# Patient Record
Sex: Male | Born: 1975
Health system: Southern US, Community
[De-identification: ages and names within clinical notes are randomized; demographics above are authoritative.]

## PROBLEM LIST (undated history)

## (undated) DIAGNOSIS — J449 Chronic obstructive pulmonary disease, unspecified: Secondary | ICD-10-CM

## (undated) DIAGNOSIS — G4730 Sleep apnea, unspecified: Secondary | ICD-10-CM

## (undated) DIAGNOSIS — S3992XA Unspecified injury of lower back, initial encounter: Secondary | ICD-10-CM

## (undated) DIAGNOSIS — R0600 Dyspnea, unspecified: Secondary | ICD-10-CM

## (undated) DIAGNOSIS — I10 Essential (primary) hypertension: Secondary | ICD-10-CM

## (undated) HISTORY — PX: CARPAL TUNNEL RELEASE: SHX101

---

## 2004-03-22 ENCOUNTER — Emergency Department: Payer: Self-pay | Admitting: Emergency Medicine

## 2004-11-28 ENCOUNTER — Emergency Department: Payer: Self-pay | Admitting: Internal Medicine

## 2005-01-09 ENCOUNTER — Emergency Department: Payer: Self-pay | Admitting: Emergency Medicine

## 2007-10-23 ENCOUNTER — Ambulatory Visit: Payer: Self-pay | Admitting: Internal Medicine

## 2008-01-14 ENCOUNTER — Emergency Department: Payer: Self-pay | Admitting: Emergency Medicine

## 2010-02-19 ENCOUNTER — Ambulatory Visit: Payer: Self-pay | Admitting: Family Medicine

## 2010-07-06 ENCOUNTER — Ambulatory Visit: Payer: Self-pay | Admitting: Internal Medicine

## 2010-08-06 ENCOUNTER — Ambulatory Visit: Payer: Self-pay | Admitting: Internal Medicine

## 2011-01-27 ENCOUNTER — Ambulatory Visit: Payer: Self-pay

## 2011-07-08 ENCOUNTER — Ambulatory Visit: Payer: Self-pay

## 2013-10-11 DIAGNOSIS — G8929 Other chronic pain: Secondary | ICD-10-CM | POA: Insufficient documentation

## 2013-10-11 DIAGNOSIS — M4306 Spondylolysis, lumbar region: Secondary | ICD-10-CM | POA: Insufficient documentation

## 2014-02-06 ENCOUNTER — Ambulatory Visit: Payer: Self-pay | Admitting: Neurology

## 2014-02-19 ENCOUNTER — Ambulatory Visit: Payer: Self-pay | Admitting: Neurology

## 2014-03-21 DIAGNOSIS — G4730 Sleep apnea, unspecified: Secondary | ICD-10-CM | POA: Insufficient documentation

## 2014-03-21 DIAGNOSIS — K219 Gastro-esophageal reflux disease without esophagitis: Secondary | ICD-10-CM | POA: Insufficient documentation

## 2014-03-21 DIAGNOSIS — E785 Hyperlipidemia, unspecified: Secondary | ICD-10-CM | POA: Insufficient documentation

## 2014-09-17 DIAGNOSIS — G5603 Carpal tunnel syndrome, bilateral upper limbs: Secondary | ICD-10-CM | POA: Insufficient documentation

## 2015-02-14 ENCOUNTER — Encounter: Payer: Self-pay | Admitting: Emergency Medicine

## 2015-02-14 ENCOUNTER — Ambulatory Visit
Admission: EM | Admit: 2015-02-14 | Discharge: 2015-02-14 | Disposition: A | Discharge status: Home / Self Care | Payer: 59 | Attending: Family Medicine | Admitting: Family Medicine

## 2015-02-14 DIAGNOSIS — L089 Local infection of the skin and subcutaneous tissue, unspecified: Secondary | ICD-10-CM

## 2015-02-14 MED ORDER — DOXYCYCLINE HYCLATE 100 MG PO CAPS: 100.0000 mg | ORAL_CAPSULE | Freq: Two times a day (BID) | ORAL | Status: DC

## 2015-02-14 NOTE — ED Provider Notes (Signed)
Patient presents today with symptoms of erythema and tenderness to the left lower leg. Patient states that he has had the symptoms for the last couple days. He believes that he got bit by a spider. Denies any fever or chills. Denies any myalgias or joint pain. No other similar rash on the body. He does admit to sticking a knife in the area to drain it yesterday. He admits that nothing seemed to drain from the area after doing this. Tetanus immunization up-to-date per patient.  ROS: Negative except mentioned above. Vitals as per Epic.  GENERAL: NAD RESP: CTA B CARD: RRR SKIN: quarter sized erythematous area to the left medial aspect of lower leg, slight raised and tender, no drainage or streaking from the site NEURO: CN II-XII grossly intact   A/P: R LE Skin Infection- Will treat with doxycycline for 10 days, keep area clean and dry, if any worsening symptoms patient is to seek medical attention as directed.   Jolene Provost, MD 02/14/15 1239

## 2015-02-14 NOTE — ED Notes (Signed)
Pt with a insect bite to left leg x 2 days

## 2015-04-30 DIAGNOSIS — M65311 Trigger thumb, right thumb: Secondary | ICD-10-CM | POA: Insufficient documentation

## 2015-07-11 DIAGNOSIS — E781 Pure hyperglyceridemia: Secondary | ICD-10-CM | POA: Insufficient documentation

## 2015-12-26 DIAGNOSIS — F5101 Primary insomnia: Secondary | ICD-10-CM | POA: Insufficient documentation

## 2016-01-23 ENCOUNTER — Emergency Department: Payer: BLUE CROSS/BLUE SHIELD

## 2016-01-23 ENCOUNTER — Encounter: Payer: Self-pay | Admitting: Emergency Medicine

## 2016-01-23 ENCOUNTER — Emergency Department
Admission: EM | Admit: 2016-01-23 | Discharge: 2016-01-23 | Disposition: A | Discharge status: Home / Self Care | Payer: BLUE CROSS/BLUE SHIELD | Attending: Emergency Medicine | Admitting: Emergency Medicine

## 2016-01-23 DIAGNOSIS — Z792 Long term (current) use of antibiotics: Secondary | ICD-10-CM | POA: Insufficient documentation

## 2016-01-23 DIAGNOSIS — Y9389 Activity, other specified: Secondary | ICD-10-CM | POA: Diagnosis not present

## 2016-01-23 DIAGNOSIS — F17210 Nicotine dependence, cigarettes, uncomplicated: Secondary | ICD-10-CM | POA: Diagnosis not present

## 2016-01-23 DIAGNOSIS — Y92410 Unspecified street and highway as the place of occurrence of the external cause: Secondary | ICD-10-CM | POA: Diagnosis not present

## 2016-01-23 DIAGNOSIS — I10 Essential (primary) hypertension: Secondary | ICD-10-CM | POA: Diagnosis not present

## 2016-01-23 DIAGNOSIS — Y999 Unspecified external cause status: Secondary | ICD-10-CM | POA: Insufficient documentation

## 2016-01-23 DIAGNOSIS — S40022A Contusion of left upper arm, initial encounter: Secondary | ICD-10-CM | POA: Diagnosis not present

## 2016-01-23 DIAGNOSIS — J449 Chronic obstructive pulmonary disease, unspecified: Secondary | ICD-10-CM | POA: Insufficient documentation

## 2016-01-23 DIAGNOSIS — S4992XA Unspecified injury of left shoulder and upper arm, initial encounter: Secondary | ICD-10-CM | POA: Diagnosis present

## 2016-01-23 HISTORY — DX: Chronic obstructive pulmonary disease, unspecified: J44.9

## 2016-01-23 HISTORY — DX: Essential (primary) hypertension: I10

## 2016-01-23 MED ORDER — HYDROCODONE-ACETAMINOPHEN 5-325 MG PO TABS: 1.0000 | ORAL_TABLET | ORAL | 0 refills | Status: DC | PRN
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
## 2016-01-23 NOTE — Discharge Instructions (Signed)
Ice and elevation. Norco for pain. Follow-up with your primary care doctor in [place] if any continued problems.
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
## 2016-09-01 ENCOUNTER — Encounter: Payer: Self-pay | Admitting: *Deleted

## 2016-09-01 ENCOUNTER — Ambulatory Visit
Admission: EM | Admit: 2016-09-01 | Discharge: 2016-09-01 | Disposition: A | Discharge status: Home / Self Care | Payer: Self-pay | Attending: Family Medicine | Admitting: Family Medicine

## 2016-09-01 ENCOUNTER — Ambulatory Visit (INDEPENDENT_AMBULATORY_CARE_PROVIDER_SITE_OTHER): Payer: Self-pay

## 2016-09-01 DIAGNOSIS — S52502A Unspecified fracture of the lower end of left radius, initial encounter for closed fracture: Secondary | ICD-10-CM

## 2016-09-01 DIAGNOSIS — S52515A Nondisplaced fracture of left radial styloid process, initial encounter for closed fracture: Secondary | ICD-10-CM

## 2016-09-01 HISTORY — DX: Unspecified injury of lower back, initial encounter: S39.92XA

## 2016-09-01 MED ORDER — HYDROCODONE-ACETAMINOPHEN 5-325 MG PO TABS: ORAL_TABLET | ORAL | 0 refills | Status: DC

## 2016-09-01 NOTE — Discharge Instructions (Signed)
Follow up with orthopedist within one week

## 2016-09-01 NOTE — ED Provider Notes (Signed)
[facility]    CSN: [id number] Arrival date & time: 09/01/16  1459     History   Chief Complaint Chief Complaint  Patient presents with  . Wrist Pain    HPI Richard Ferguson is a 41 y.o. male.   41 yo male presents with a c/o left wrist pain and swelling  after slipping and falling in his shop today. States he landed on his outstretched hand.    The history is provided by the patient.  Wrist Pain     Past Medical History:  Diagnosis Date  . Back injuries   . COPD (chronic obstructive pulmonary disease) (HCC)   . Hypertension     There are no active problems to display for this patient.   History reviewed. No pertinent surgical history.     Home Medications    Prior to Admission medications   Medication Sig Start Date End Date Taking? Authorizing Provider  cyclobenzaprine (FLEXERIL) 10 MG tablet Take 10 mg by mouth at bedtime.   Yes Historical Provider, MD  valsartan (DIOVAN) 320 MG tablet Take 320 mg by mouth daily.   Yes Historical Provider, MD  doxycycline (VIBRAMYCIN) 100 MG capsule Take 1 capsule (100 mg total) by mouth 2 (two) times daily. 02/14/15   Jolene Provost, MD  HYDROcodone-acetaminophen (NORCO/VICODIN) 5-325 MG tablet 1-2 tabs po bid prn 09/01/16   Payton Mccallum, MD    Family History History reviewed. No pertinent family history.  Social History Social History  Substance Use Topics  . Smoking status: Current Every Day Smoker    Packs/day: 2.00    Types: Cigarettes  . Smokeless tobacco: Never Used  . Alcohol use No     Allergies   Aleve [naproxen sodium]   Review of Systems Review of Systems   Physical Exam Triage Vital Signs ED Triage Vitals  Enc Vitals Group     BP 09/01/16 1511 (!) 149/110     Pulse Rate 09/01/16 1511 (!) 112     Resp 09/01/16 1511 18     Temp 09/01/16 1511 98.2 F (36.8 C)     Temp Source 09/01/16 1511 Oral     SpO2 09/01/16 1511 98 %     Weight 09/01/16 1512 295 lb (133.8 kg)     Height  09/01/16 1512  (1.803 m)     Head Circumference --      Peak Flow --      Pain Score 09/01/16 1512 6     Pain Loc --      Pain Edu? --      Excl. in GC? --    No data found.   Updated Vital Signs BP (!) 149/110 (BP Location: Right Arm)   Pulse (!) 112   Temp 98.2 F (36.8 C) (Oral)   Resp 18   Ht  (1.803 m)   Wt 295 lb (133.8 kg)   SpO2 98%   BMI 41.14 kg/m   Visual Acuity Right Eye Distance:   Left Eye Distance:   Bilateral Distance:    Right Eye Near:   Left Eye Near:    Bilateral Near:     Physical Exam  Constitutional: He appears well-developed and well-nourished. No distress.  Musculoskeletal:       Left wrist: He exhibits decreased range of motion, tenderness, bony tenderness and swelling. He exhibits no effusion, no crepitus, no deformity and no laceration.  Skin: He is not diaphoretic.  Nursing note and vitals reviewed.  UC Treatments / Results  Labs (all labs ordered are listed, but only abnormal results are displayed) Labs Reviewed - No data to display  EKG  EKG Interpretation None       Radiology Dg Wrist Complete Left  Result Date: 09/01/2016 CLINICAL DATA:  Fall on outstretched hands with wrist pain, initial encounter EXAM: LEFT WRIST - COMPLETE 3+ VIEW COMPARISON:  None. FINDINGS: Small avulsion fracture from the ulnar styloid is noted. On the lateral projection there is an undisplaced fracture along the posterior aspect of the radius without significant displacement. This is not well visualized on any of the other images. No other focal abnormality is seen. IMPRESSION: Distal radial and ulnar fractures as described. Electronically Signed   By: Alcide Clever M.D.   On: 09/01/2016 15:56    Procedures Procedures (including critical care time)  Medications Ordered in UC Medications - No data to display   Initial Impression / Assessment and Plan / UC Course  I have reviewed the triage vital signs and the nursing  notes.  Pertinent labs & imaging results that were available during my care of the patient were reviewed by me and considered in my medical decision making (see chart for details).       Final Clinical Impressions(s) / UC Diagnoses   Final diagnoses:  Closed traumatic nondisplaced fracture of distal end of left radius, initial encounter  Closed nondisplaced fracture of styloid process of left radius, initial encounter    New Prescriptions Discharge Medication List as of 09/01/2016  4:42 PM     1. x-ray results and diagnosis reviewed with patient 2. Wrist/forearm immobilized with sugar tong splint; patient given sling 3. rx as per orders above; reviewed possible side effects, interactions, risks and benefits; rx printed for vicodin 3. Recommend supportive treatment with elevation, otc analgesics prn 4. Recommend follow up with orthopedic surgeon this week 5. Follow-up here prn    Payton Mccallum, MD 09/01/16 1752

## 2016-09-01 NOTE — ED Triage Notes (Signed)
Patient slipped and fell on his left wrist today. Swelling is visible above the left posterior left hand.

## 2016-11-29 ENCOUNTER — Ambulatory Visit
Admission: EM | Admit: 2016-11-29 | Discharge: 2016-11-29 | Disposition: A | Discharge status: Home / Self Care | Payer: Self-pay | Attending: Family Medicine | Admitting: Family Medicine

## 2016-11-29 ENCOUNTER — Ambulatory Visit (INDEPENDENT_AMBULATORY_CARE_PROVIDER_SITE_OTHER): Payer: Self-pay

## 2016-11-29 DIAGNOSIS — J441 Chronic obstructive pulmonary disease with (acute) exacerbation: Secondary | ICD-10-CM

## 2016-11-29 DIAGNOSIS — R0602 Shortness of breath: Secondary | ICD-10-CM

## 2016-11-29 DIAGNOSIS — R059 Cough, unspecified: Secondary | ICD-10-CM

## 2016-11-29 DIAGNOSIS — R05 Cough: Secondary | ICD-10-CM

## 2016-11-29 DIAGNOSIS — J040 Acute laryngitis: Secondary | ICD-10-CM

## 2016-11-29 MED ORDER — DOXYCYCLINE HYCLATE 100 MG PO CAPS: 100.0000 mg | ORAL_CAPSULE | Freq: Two times a day (BID) | ORAL | 0 refills | Status: DC

## 2016-11-29 MED ORDER — PREDNISONE 10 MG PO TABS
ORAL_TABLET | ORAL | 0 refills | Status: DC
Start: 2016-11-29 — End: 2017-03-22

## 2016-11-29 MED ORDER — IPRATROPIUM-ALBUTEROL 0.5-2.5 (3) MG/3ML IN SOLN
3.0000 mL | Freq: Once | RESPIRATORY_TRACT | Status: AC
  Administered 2016-11-29: 3 mL via RESPIRATORY_TRACT

## 2016-11-29 MED ORDER — ALBUTEROL SULFATE HFA 108 (90 BASE) MCG/ACT IN AERS: 2.0000 | INHALATION_SPRAY | RESPIRATORY_TRACT | 0 refills | Status: DC | PRN

## 2016-11-29 NOTE — Discharge Instructions (Signed)
Take medication as prescribed. Rest. Drink plenty of fluids.   Follow up with your primary care physician this week as needed. Return to Urgent care or Emergency room for new or worsening concerns.

## 2016-11-29 NOTE — ED Provider Notes (Addendum)
[facility] ____________________________________________  Time seen: Approximately 11:59 AM  I have reviewed the triage vital signs and the nursing notes.   HISTORY  Chief Complaint Cough; Shortness of Breath; and Laryngitis  HPI Richard Ferguson is a 41 y.o. male presenting for evaluation 8 days runny nose, nasal congestion, cough and chest congestion. Reports his symptoms have gradually worsened. Patient reports he is now having chest tightness and shortness of breath. Reports wheezing with associated chest tightness and shortness of breath. Reports he does have a chronic history of COPD, chronic smoker with a history of chronic bronchitis. Patient states he has not had issues in the last few years and no longer has inhalers to use at home. States cough is primarily a dry hacking cough. Denies hemoptysis. Reports nasal congestion has overall improved. Reports some scratchy throat sensation, denies sore throat. Denies sinus pressure. Reports this does feel similar to previous COPD exacerbations. Reports no shortness of breath on exertion. Reports is continue to remain active, continuing to work and be outside. States over-the-counter Chloraseptic spray does help with his cough. Denies taking other over-the-counter medications. Denies other aggravating or alleviating factors.  Denies chest pain, chest pain with deep breath, abdominal pain, dysuria, extremity pain, extremity swelling or rash. Denies recent trip. Denies history of cancer. Denies previous PE or DVT. Denies recent sickness. Denies recent antibiotic use.   PCP: Maryjane HurterFeldpausch     Past Medical History:  Diagnosis Date  . Back injuries   . COPD (chronic obstructive pulmonary disease) (HCC)   . Hypertension     There are no active problems to display for this patient.   History reviewed. No pertinent surgical history.   No current facility-administered medications for this encounter.   Current Outpatient  Prescriptions:  .  cyclobenzaprine (FLEXERIL) 10 MG tablet, Take 10 mg by mouth at bedtime., Disp: , Rfl:  .  valsartan (DIOVAN) 320 MG tablet, Take 320 mg by mouth daily., Disp: , Rfl:   Allergies Aleve [naproxen sodium]  No family history on file.  Social History Social History  Substance Use Topics  . Smoking status: Current Every Day Smoker    Packs/day: 2.00    Types: Cigarettes  . Smokeless tobacco: Never Used  . Alcohol use No    Review of Systems Constitutional: No fever/chills ENT: No sore throat. Cardiovascular: Denies chest pain. Respiratory: Positive shortness of breath. Gastrointestinal: No abdominal pain. . Genitourinary: Negative for dysuria. Musculoskeletal: Chronic back pain, denies change.  Skin: Negative for rash.  ____________________________________________   PHYSICAL EXAM:  VITAL SIGNS: ED Triage Vitals  Enc Vitals Group     BP 11/29/16 1143 (!) 170/92     Pulse Rate 11/29/16 1143 97     Resp -- 18     Temp 11/29/16 1143 98.1 F (36.7 C)     Temp Source 11/29/16 1143 Oral     SpO2 11/29/16 1143 98 %     Weight 11/29/16 1146 291 lb (132 kg)     Height 11/29/16 1146 5\' 11"  (1.803 m)     Head Circumference --      Peak Flow --      Pain Score --      Pain Loc --      Pain Edu? --      Excl. in GC? --    Constitutional: Alert and oriented. Well appearing and in no acute distress. Eyes: Conjunctivae are normal.  Head: Atraumatic. No sinus tenderness to palpation. No swelling. No erythema.  Ears: no erythema, normal TMs bilaterally.   Nose:Mild nasal congestion.  Mouth/Throat: Mucous membranes are moist. No pharyngeal erythema. No tonsillar swelling or exudate.  Neck: No stridor.  No cervical spine tenderness to palpation. Hematological/Lymphatic/Immunilogical: No cervical lymphadenopathy. Cardiovascular: Normal rate, regular rhythm. Grossly normal heart sounds.  Good peripheral circulation. Respiratory: Normal respiratory effort.  No  retractions.  Mild inspiratory and expiratory wheezes throughout. No focal area of consolidation auscultated. Speaks in complete sentences.  Gastrointestinal: Soft and nontender. Normal Bowel sounds. No CVA tenderness. Musculoskeletal: Ambulatory with steady gait. No cervical, thoracic or lumbar tenderness to palpation. No lower extremity edema noted.  Neurologic:  Normal speech and language. No gait instability. Skin:  Skin appears warm, dry. Psychiatric: Mood and affect are normal. Speech and behavior are normal.  PERC PE score=0.  _________________________________________   LABS (all labs ordered are listed, but only abnormal results are displayed)  Labs Reviewed - No data to display ____________________________________________  RADIOLOGY  Dg Chest 2 View  Result Date: 11/29/2016 CLINICAL DATA:  Sick for 8 days with nonproductive cough. EXAM: CHEST  2 VIEW COMPARISON:  None. FINDINGS: The heart size and mediastinal contours are within normal limits. Both lungs are clear. There is mild prominence of the central markings which could represent acute or chronic bronchitis. The visualized skeletal structures are unremarkable. IMPRESSION: No areas of focal consolidation. Mild prominence of the central markings which could represent acute or chronic bronchitis. Electronically Signed   By: Elsie Stain M.D.   On: 11/29/2016 12:33   ____________________________________________   PROCEDURES Procedures     INITIAL IMPRESSION / ASSESSMENT AND PLAN / ED COURSE  Pertinent labs & imaging results that were available during my care of the patient were reviewed by me and considered in my medical decision making (see chart for details).  Overall well-appearing patient. No acute distress. Presenting with cough and congestion symptoms for the last 8 days with associated wheezing and shortness of breath. Consistent with previous COPD exacerbation. Will evaluate chest x-ray. DuoNeb 2. Will  reevaluate. Suspect CPD exacerbation.  Chest x-ray reviewed, no focal areas of consolidation. Suspect COPD exacerbation. Post nebs, patient reports he is feeling much better. Reports shortness of breath resolved. Patient does still continue with mild scattered wheezing. Will treat with oral doxycycline, albuterol inhaler when necessary, prednisone taper. Patient denies need for prescription cough medication. Encouraged rest, fluids and close PCP follow-up. Encouraged supportive care. Discussed strict follow-up and return parameters for any chest pain, continued shortness of breath, hemoptysis, worsening concerns.Discussed indication, risks and benefits of medications with patient.  Discussed follow up with Primary care physician this week. Discussed follow up and return parameters including no resolution or any worsening concerns. Patient verbalized understanding and agreed to plan.   ____________________________________________   FINAL CLINICAL IMPRESSION(S) / ED DIAGNOSES  Final diagnoses:  COPD exacerbation (HCC)  Shortness of breath  Cough     Discharge Medication List as of 11/29/2016 12:57 PM    START taking these medications   Details  albuterol (PROVENTIL HFA;VENTOLIN HFA) 108 (90 Base) MCG/ACT inhaler Inhale 2 puffs into the lungs every 4 (four) hours as needed for wheezing or shortness of breath., Starting Mon 11/29/2016, Normal    predniSONE (DELTASONE) 10 MG tablet Start 60 mg po day one, then 50 mg po day two, taper by 10 mg daily until complete., Normal        Note: This dictation was prepared with Dragon dictation along with smaller phrase technology. Any transcriptional errors that  result from this process are unintentional.           Renford Dills, NP 11/29/16 1313

## 2016-11-29 NOTE — ED Triage Notes (Signed)
Patient stated that symptoms started last Sunday. Some wheezing and Shortness of breath.

## 2017-03-22 ENCOUNTER — Ambulatory Visit
Admission: EM | Admit: 2017-03-22 | Discharge: 2017-03-22 | Disposition: A | Discharge status: Home / Self Care | Payer: Self-pay | Attending: Family Medicine | Admitting: Family Medicine

## 2017-03-22 DIAGNOSIS — J441 Chronic obstructive pulmonary disease with (acute) exacerbation: Secondary | ICD-10-CM

## 2017-03-22 DIAGNOSIS — R062 Wheezing: Secondary | ICD-10-CM

## 2017-03-22 DIAGNOSIS — R05 Cough: Secondary | ICD-10-CM

## 2017-03-22 MED ORDER — PREDNISONE 20 MG PO TABS: ORAL_TABLET | ORAL | 0 refills | Status: DC

## 2017-03-22 MED ORDER — HYDROCOD POLST-CPM POLST ER 10-8 MG/5ML PO SUER: 5.0000 mL | Freq: Two times a day (BID) | ORAL | 0 refills | Status: DC | PRN

## 2017-03-22 NOTE — ED Triage Notes (Signed)
Pt reports he has had a cough, mostly non-productive, for two weeks. No fever, but has headache, laryngitis. Pt went to his PCP and was diagnosed with Bronchitis on Friday and given Doxycycline but no improvement. Pt smokes 2 packs of cigarettes daily.

## 2017-03-22 NOTE — ED Provider Notes (Signed)
[facility]    CSN: [id number] Arrival date & time: 03/22/17  0849     History   Chief Complaint Chief Complaint  Patient presents with  . Cough    HPI Richard Ferguson is a 41 y.o. male.   The history is provided by the patient.  Cough  Associated symptoms: wheezing (improved)   URI  Presenting symptoms: cough and fatigue   Severity:  Moderate Onset quality:  Sudden Duration:  7 days Timing:  Constant Progression:  Improving Chronicity:  Recurrent Relieved by:  Prescription medications (seen by PCP on Friday (4 days ago) and prescribed doxycycline and albuterol; states has improved "but still have a cough") Associated symptoms: wheezing (improved)   Risk factors: chronic respiratory disease (copd)     Past Medical History:  Diagnosis Date  . Back injuries   . COPD (chronic obstructive pulmonary disease) (HCC)   . Hypertension     There are no active problems to display for this patient.   Past Surgical History:  Procedure Laterality Date  . CARPAL TUNNEL RELEASE         Home Medications    Prior to Admission medications   Medication Sig Start Date End Date Taking? Authorizing Provider  hydrochlorothiazide (HYDRODIURIL) 50 MG tablet Take 50 mg by mouth daily.   Yes [provider]  albuterol (PROVENTIL HFA;VENTOLIN HFA) 108 (90 Base) MCG/ACT inhaler Inhale 2 puffs into the lungs every 4 (four) hours as needed for wheezing or shortness of breath. 11/29/16   Renford Dills, NP  chlorpheniramine-HYDROcodone [facility] PENNKINETIC ER) 10-8 MG/5ML SUER Take 5 mLs by mouth every 12 (twelve) hours as needed. 03/22/17   Payton Mccallum, MD  cyclobenzaprine (FLEXERIL) 10 MG tablet Take 10 mg by mouth at bedtime.    [provider]  doxycycline (VIBRAMYCIN) 100 MG capsule Take 1 capsule (100 mg total) by mouth 2 (two) times daily. 11/29/16   Renford Dills, NP  HYDROcodone-acetaminophen (NORCO/VICODIN) 5-325 MG tablet 1-2 tabs po bid prn  09/01/16   Payton Mccallum, MD  predniSONE (DELTASONE) 20 MG tablet 3 tabs po qd x 2 days, then 2 tabs po qd for 3 days, then 1 tab po qd x3 days, then half a tab po qd x 2 days. 03/22/17   Payton Mccallum, MD  valsartan (DIOVAN) 320 MG tablet Take 320 mg by mouth daily.    [provider]    Family History History reviewed. No pertinent family history.  Social History Social History  Substance Use Topics  . Smoking status: Current Every Day Smoker    Packs/day: 2.00    Types: Cigarettes  . Smokeless tobacco: Never Used  . Alcohol use Yes     Comment: social     Allergies   Aleve [naproxen sodium]   Review of Systems Review of Systems  Constitutional: Positive for fatigue.  Respiratory: Positive for cough and wheezing (improved).      Physical Exam Triage Vital Signs ED Triage Vitals [03/22/17 0904]  Enc Vitals Group     BP (!) 164/86     Pulse Rate 80     Resp 20     Temp 97.6 F (36.4 C)     Temp Source Oral     SpO2 100 %     Weight 255 lb (115.7 kg)     Height 5\' 10"  (1.778 m)     Head Circumference      Peak Flow      Pain Score 5  Pain Loc      Pain Edu?      Excl. in GC?    No data found.   Updated Vital Signs BP (!) 164/86 (BP Location: Left Arm)   Pulse 80   Temp 97.6 F (36.4 C) (Oral)   Resp 20   Ht 5\' 10"  (1.778 m)   Wt 255 lb (115.7 kg)   SpO2 100%   BMI 36.59 kg/m   Visual Acuity Right Eye Distance:   Left Eye Distance:   Bilateral Distance:    Right Eye Near:   Left Eye Near:    Bilateral Near:     Physical Exam  Constitutional: He appears well-developed and well-nourished. No distress.  HENT:  Mouth/Throat: Uvula is midline and mucous membranes are normal.  Neck: Normal range of motion. Neck supple. No tracheal deviation present. No thyromegaly present.  Cardiovascular: Normal rate, regular rhythm and normal heart sounds.   Pulmonary/Chest: Effort normal and breath sounds normal. No stridor. No respiratory  distress. He has no wheezes. He has no rales. He exhibits no tenderness.  Lymphadenopathy:    He has no cervical adenopathy.  Neurological: He is alert.  Skin: Skin is warm and dry. No rash noted. He is not diaphoretic.  Nursing note and vitals reviewed.    UC Treatments / Results  Labs (all labs ordered are listed, but only abnormal results are displayed) Labs Reviewed - No data to display  EKG  EKG Interpretation None       Radiology No results found.  Procedures Procedures (including critical care time)  Medications Ordered in UC Medications - No data to display   Initial Impression / Assessment and Plan / UC Course  I have reviewed the triage vital signs and the nursing notes.  Pertinent labs & imaging results that were available during my care of the patient were reviewed by me and considered in my medical decision making (see chart for details).        Final Clinical Impressions(s) / UC Diagnoses   Final diagnoses:  COPD exacerbation (HCC)    New Prescriptions Discharge Medication List as of 03/22/2017  9:26 AM    START taking these medications   Details  chlorpheniramine-HYDROcodone (TUSSIONEX PENNKINETIC ER) 10-8 MG/5ML SUER Take 5 mLs by mouth every 12 (twelve) hours as needed., Starting Tue 03/22/2017, Normal       1. diagnosis reviewed with patient 2. rx as per orders above; reviewed possible side effects, interactions, risks and benefits  3. Recommend continue current antibiotic (doxcycline) and albuterol  4. Follow-up prn if symptoms worsen or don't improve  Controlled Substance Prescriptions [place] Controlled Substance Registry consulted? Not Applicable   Payton Mccallumonty, Christi Wirick, MD 03/22/17 [phone]

## 2017-04-29 ENCOUNTER — Ambulatory Visit
Admission: EM | Admit: 2017-04-29 | Discharge: 2017-04-29 | Disposition: A | Discharge status: Other Acute Inpatient Hospital | Payer: Self-pay

## 2017-04-29 NOTE — ED Triage Notes (Signed)
Patient started having symptoms of dizziness and passing out this AM. Patient and friend are poor historians, unable to assess symptoms and reason for present condition leading up to patient coming to urgent care. [facility] days ago for chest pain and chest congestion. Patient was unable to transfer to a wheel chair from the passenger seat and due to patient weight, size and for his own safety, EMS was called to transfer [facility] ED. First responder arrived from [employer], and assumed responsibility for the patient.

## 2018-08-08 ENCOUNTER — Other Ambulatory Visit: Payer: Self-pay

## 2018-08-08 ENCOUNTER — Encounter: Payer: Self-pay | Admitting: Emergency Medicine

## 2018-08-08 ENCOUNTER — Ambulatory Visit
Admission: EM | Admit: 2018-08-08 | Discharge: 2018-08-08 | Disposition: A | Discharge status: Home / Self Care | Payer: Self-pay | Attending: Family Medicine | Admitting: Family Medicine

## 2018-08-08 DIAGNOSIS — F17210 Nicotine dependence, cigarettes, uncomplicated: Secondary | ICD-10-CM

## 2018-08-08 DIAGNOSIS — J441 Chronic obstructive pulmonary disease with (acute) exacerbation: Secondary | ICD-10-CM

## 2018-08-08 DIAGNOSIS — I10 Essential (primary) hypertension: Secondary | ICD-10-CM

## 2018-08-08 MED ORDER — PREDNISONE 50 MG PO TABS: ORAL_TABLET | ORAL | 0 refills | Status: DC

## 2018-08-08 MED ORDER — HYDROCODONE-HOMATROPINE 5-1.5 MG/5ML PO SYRP: 5.0000 mL | ORAL_SOLUTION | Freq: Four times a day (QID) | ORAL | 0 refills | Status: DC | PRN

## 2018-08-08 MED ORDER — DOXYCYCLINE HYCLATE 100 MG PO CAPS: 100.0000 mg | ORAL_CAPSULE | Freq: Two times a day (BID) | ORAL | 0 refills | Status: DC

## 2018-08-08 MED ORDER — AMLODIPINE BESYLATE 10 MG PO TABS: 10.0000 mg | ORAL_TABLET | Freq: Every day | ORAL | 0 refills | Status: DC

## 2018-08-08 NOTE — ED Triage Notes (Signed)
Patient c/o  cough and chest congestion for the past 2 weeks.  Patient denies fevers. 

## 2018-08-08 NOTE — Discharge Instructions (Signed)
Medications as prescribed.  Please follow up with your PCP.   Take care  Dr. Celestina Gironda  

## 2018-08-08 NOTE — ED Provider Notes (Signed)
[facility]    CSN: [id number] Arrival date & time: 08/08/18  1017  History   Chief Complaint Chief Complaint  Patient presents with  . Cough   HPI  43 year old male presents with cough.  Cough and congestion for the past 2 weeks.  Has a history of COPD.  Continues to smoke.  Cough is nonproductive at this time.  No reports of shortness of breath.  No fever. He has been using DayQuil and NyQuil without resolution.  No known exacerbating factors.  Patient reports that he is having difficulty sleeping due to the cough.  No other associated symptoms.  No other complaints  PMH, Surgical Hx, Family Hx, Social History reviewed and updated as below.  Past Medical History:  Diagnosis Date  . Back injuries   . COPD (chronic obstructive pulmonary disease) (HCC)   . Hypertension    Past Surgical History:  Procedure Laterality Date  . CARPAL TUNNEL RELEASE     Home Medications    Prior to Admission medications   Medication Sig Start Date End Date Taking? Authorizing Provider  amLODipine (NORVASC) 10 MG tablet Take 1 tablet (10 mg total) by mouth daily. 08/08/18   Tommie Sams, DO  doxycycline (VIBRAMYCIN) 100 MG capsule Take 1 capsule (100 mg total) by mouth 2 (two) times daily. 08/08/18   Tommie Sams, DO  HYDROcodone-homatropine (HYCODAN) 5-1.5 MG/5ML syrup Take 5 mLs by mouth every 6 (six) hours as needed. 08/08/18   Tommie Sams, DO  predniSONE (DELTASONE) 50 MG tablet 1 tablet daily x 5 days 08/08/18   Tommie Sams, DO    Family History Family History  Problem Relation Age of Onset  . Hypertension Mother   . Hypertension Father     Social History Social History   Tobacco Use  . Smoking status: Current Every Day Smoker    Packs/day: 2.00    Types: Cigarettes  . Smokeless tobacco: Never Used  Substance Use Topics  . Alcohol use: Yes    Comment: social  . Drug use: No     Allergies   Aleve [naproxen sodium]   Review of Systems Review of Systems   Constitutional: Negative for fever.  Respiratory: Positive for cough.    Physical Exam Triage Vital Signs ED Triage Vitals  Enc Vitals Group     BP 08/08/18 1033 (!) 189/120     Pulse Rate 08/08/18 1033 94     Resp 08/08/18 1033 16     Temp 08/08/18 1033 98 F (36.7 C)     Temp Source 08/08/18 1033 Oral     SpO2 08/08/18 1033 100 %     Weight 08/08/18 1030 250 lb (113.4 kg)     Height 08/08/18 1030 5\' 10"  (1.778 m)     Head Circumference --      Peak Flow --      Pain Score 08/08/18 1029 4     Pain Loc --      Pain Edu? --      Excl. in GC? --    Updated Vital Signs BP (!) 189/120 (BP Location: Left Arm) Comment: Patient states that he stopped taking his BP medicine 2 years ago.   Pulse 94   Temp 98 F (36.7 C) (Oral)   Resp 16   Ht 5\' 10"  (1.778 m)   Wt 113.4 kg   SpO2 100%   BMI 35.87 kg/m   Visual Acuity Right Eye Distance:   Left Eye Distance:  Bilateral Distance:    Right Eye Near:   Left Eye Near:    Bilateral Near:     Physical Exam Vitals signs and nursing note reviewed.  Constitutional:      General: He is not in acute distress.    Appearance: Normal appearance.  HENT:     Head: Normocephalic and atraumatic.  Eyes:     General: No scleral icterus.    Conjunctiva/sclera: Conjunctivae normal.  Cardiovascular:     Rate and Rhythm: Normal rate and regular rhythm.  Pulmonary:     Effort: Pulmonary effort is normal.     Breath sounds: Normal breath sounds.  Neurological:     Mental Status: He is alert.  Psychiatric:        Mood and Affect: Mood normal.        Behavior: Behavior normal.    UC Treatments / Results  Labs (all labs ordered are listed, but only abnormal results are displayed) Labs Reviewed - No data to display  EKG None  Radiology No results found.  Procedures Procedures (including critical care time)  Medications Ordered in UC Medications - No data to display  Initial Impression / Assessment and Plan / UC Course  I  have reviewed the triage vital signs and the nursing notes.  Pertinent labs & imaging results that were available during my care of the patient were reviewed by me and considered in my medical decision making (see chart for details).    43 year old male presents with COPD exacerbation.  Patient also has uncontrolled hypertension due to non-compliance/lack of follow-up.  Treating with prednisone, doxycycline.  Hycodan for cough.  Starting patient on amlodipine for hypertension.  Final Clinical Impressions(s) / UC Diagnoses   Final diagnoses:  COPD exacerbation (HCC)  Uncontrolled hypertension     Discharge Instructions     Medications as prescribed.  Please follow up with your PCP.  Take care  Dr. Adriana Simas    ED Prescriptions    Medication Sig Dispense Auth. Provider   amLODipine (NORVASC) 10 MG tablet Take 1 tablet (10 mg total) by mouth daily. 90 tablet Tonny Isensee G, DO   predniSONE (DELTASONE) 50 MG tablet 1 tablet daily x 5 days 5 tablet Morenike Cuff G, DO   doxycycline (VIBRAMYCIN) 100 MG capsule Take 1 capsule (100 mg total) by mouth 2 (two) times daily. 14 capsule Laelani Vasko G, DO   HYDROcodone-homatropine (HYCODAN) 5-1.5 MG/5ML syrup Take 5 mLs by mouth every 6 (six) hours as needed. 120 mL Tommie Sams, DO     Controlled Substance Prescriptions [place] Controlled Substance Registry consulted? Not Applicable   Tommie Sams, DO 08/08/18 1052

## 2018-12-17 ENCOUNTER — Encounter (HOSPITAL_COMMUNITY): Payer: Self-pay

## 2018-12-17 ENCOUNTER — Other Ambulatory Visit: Payer: Self-pay

## 2018-12-17 ENCOUNTER — Emergency Department (HOSPITAL_COMMUNITY): Payer: Self-pay

## 2018-12-17 ENCOUNTER — Inpatient Hospital Stay (HOSPITAL_COMMUNITY): Payer: Self-pay

## 2018-12-17 ENCOUNTER — Inpatient Hospital Stay (HOSPITAL_COMMUNITY)
Admission: EM | Admit: 2018-12-17 | Discharge: 2018-12-23 | DRG: 464 | Disposition: A | Discharge status: Rehabilitation Facility | Payer: Self-pay | Attending: Student | Admitting: Student

## 2018-12-17 DIAGNOSIS — S52571A Other intraarticular fracture of lower end of right radius, initial encounter for closed fracture: Secondary | ICD-10-CM | POA: Diagnosis present

## 2018-12-17 DIAGNOSIS — S62021A Displaced fracture of middle third of navicular [scaphoid] bone of right wrist, initial encounter for closed fracture: Secondary | ICD-10-CM | POA: Diagnosis present

## 2018-12-17 DIAGNOSIS — Z8249 Family history of ischemic heart disease and other diseases of the circulatory system: Secondary | ICD-10-CM | POA: Diagnosis not present

## 2018-12-17 DIAGNOSIS — S52601A Unspecified fracture of lower end of right ulna, initial encounter for closed fracture: Secondary | ICD-10-CM | POA: Diagnosis present

## 2018-12-17 DIAGNOSIS — S81812A Laceration without foreign body, left lower leg, initial encounter: Secondary | ICD-10-CM | POA: Diagnosis present

## 2018-12-17 DIAGNOSIS — F17200 Nicotine dependence, unspecified, uncomplicated: Secondary | ICD-10-CM

## 2018-12-17 DIAGNOSIS — S334XXA Traumatic rupture of symphysis pubis, initial encounter: Secondary | ICD-10-CM

## 2018-12-17 DIAGNOSIS — D62 Acute posthemorrhagic anemia: Secondary | ICD-10-CM | POA: Diagnosis present

## 2018-12-17 DIAGNOSIS — F10129 Alcohol abuse with intoxication, unspecified: Secondary | ICD-10-CM | POA: Diagnosis present

## 2018-12-17 DIAGNOSIS — K5900 Constipation, unspecified: Secondary | ICD-10-CM | POA: Diagnosis present

## 2018-12-17 DIAGNOSIS — S329XXA Fracture of unspecified parts of lumbosacral spine and pelvis, initial encounter for closed fracture: Secondary | ICD-10-CM

## 2018-12-17 DIAGNOSIS — Z20828 Contact with and (suspected) exposure to other viral communicable diseases: Secondary | ICD-10-CM | POA: Diagnosis present

## 2018-12-17 DIAGNOSIS — S62131A Displaced fracture of capitate [os magnum] bone, right wrist, initial encounter for closed fracture: Secondary | ICD-10-CM

## 2018-12-17 DIAGNOSIS — J449 Chronic obstructive pulmonary disease, unspecified: Secondary | ICD-10-CM | POA: Diagnosis present

## 2018-12-17 DIAGNOSIS — Z419 Encounter for procedure for purposes other than remedying health state, unspecified: Secondary | ICD-10-CM

## 2018-12-17 DIAGNOSIS — S52501A Unspecified fracture of the lower end of right radius, initial encounter for closed fracture: Secondary | ICD-10-CM

## 2018-12-17 DIAGNOSIS — S32810A Multiple fractures of pelvis with stable disruption of pelvic ring, initial encounter for closed fracture: Principal | ICD-10-CM | POA: Diagnosis present

## 2018-12-17 DIAGNOSIS — F17210 Nicotine dependence, cigarettes, uncomplicated: Secondary | ICD-10-CM | POA: Diagnosis present

## 2018-12-17 DIAGNOSIS — S62111A Displaced fracture of triquetrum [cuneiform] bone, right wrist, initial encounter for closed fracture: Secondary | ICD-10-CM

## 2018-12-17 DIAGNOSIS — Z841 Family history of disorders of kidney and ureter: Secondary | ICD-10-CM

## 2018-12-17 DIAGNOSIS — Y907 Blood alcohol level of 200-239 mg/100 ml: Secondary | ICD-10-CM | POA: Diagnosis present

## 2018-12-17 DIAGNOSIS — I700 Atherosclerosis of aorta: Secondary | ICD-10-CM | POA: Diagnosis present

## 2018-12-17 DIAGNOSIS — G4733 Obstructive sleep apnea (adult) (pediatric): Secondary | ICD-10-CM | POA: Diagnosis present

## 2018-12-17 DIAGNOSIS — S50311A Abrasion of right elbow, initial encounter: Secondary | ICD-10-CM | POA: Diagnosis present

## 2018-12-17 DIAGNOSIS — I10 Essential (primary) hypertension: Secondary | ICD-10-CM | POA: Diagnosis present

## 2018-12-17 DIAGNOSIS — T1490XA Injury, unspecified, initial encounter: Secondary | ICD-10-CM | POA: Diagnosis present

## 2018-12-17 DIAGNOSIS — Y92410 Unspecified street and highway as the place of occurrence of the external cause: Secondary | ICD-10-CM | POA: Diagnosis not present

## 2018-12-17 DIAGNOSIS — S62344B Nondisplaced fracture of base of fourth metacarpal bone, right hand, initial encounter for open fracture: Secondary | ICD-10-CM

## 2018-12-17 DIAGNOSIS — E871 Hypo-osmolality and hyponatremia: Secondary | ICD-10-CM | POA: Diagnosis present

## 2018-12-17 DIAGNOSIS — S3210XA Unspecified fracture of sacrum, initial encounter for closed fracture: Secondary | ICD-10-CM | POA: Diagnosis present

## 2018-12-17 DIAGNOSIS — S52572A Other intraarticular fracture of lower end of left radius, initial encounter for closed fracture: Secondary | ICD-10-CM | POA: Diagnosis present

## 2018-12-17 DIAGNOSIS — N179 Acute kidney failure, unspecified: Secondary | ICD-10-CM | POA: Diagnosis present

## 2018-12-17 DIAGNOSIS — S62342B Nondisplaced fracture of base of third metacarpal bone, right hand, initial encounter for open fracture: Secondary | ICD-10-CM | POA: Diagnosis present

## 2018-12-17 DIAGNOSIS — I952 Hypotension due to drugs: Secondary | ICD-10-CM | POA: Diagnosis not present

## 2018-12-17 DIAGNOSIS — Y92238 Other place in hospital as the place of occurrence of the external cause: Secondary | ICD-10-CM | POA: Diagnosis not present

## 2018-12-17 DIAGNOSIS — D696 Thrombocytopenia, unspecified: Secondary | ICD-10-CM | POA: Diagnosis present

## 2018-12-17 DIAGNOSIS — S52531A Colles' fracture of right radius, initial encounter for closed fracture: Secondary | ICD-10-CM

## 2018-12-17 DIAGNOSIS — T402X5A Adverse effect of other opioids, initial encounter: Secondary | ICD-10-CM | POA: Diagnosis not present

## 2018-12-17 DIAGNOSIS — S52502A Unspecified fracture of the lower end of left radius, initial encounter for closed fracture: Secondary | ICD-10-CM

## 2018-12-17 DIAGNOSIS — T148XXA Other injury of unspecified body region, initial encounter: Secondary | ICD-10-CM

## 2018-12-17 HISTORY — DX: Fracture of unspecified parts of lumbosacral spine and pelvis, initial encounter for closed fracture: S32.9XXA

## 2018-12-17 LAB — COMPREHENSIVE METABOLIC PANEL
ALT: 25 U/L (ref 0–44)
AST: 37 U/L (ref 15–41)
Albumin: 3.7 g/dL (ref 3.5–5.0)
Alkaline Phosphatase: 67 U/L (ref 38–126)
Anion gap: 10 (ref 5–15)
BUN: 12 mg/dL (ref 6–20)
CO2: 19 mmol/L — ABNORMAL LOW (ref 22–32)
Calcium: 7.8 mg/dL — ABNORMAL LOW (ref 8.9–10.3)
Chloride: 99 mmol/L (ref 98–111)
Creatinine, Ser: 0.98 mg/dL (ref 0.61–1.24)
GFR calc Af Amer: >60 mL/min (ref >60)
GFR calc non Af Amer: >60 mL/min (ref >60)
Glucose, Bld: 114 mg/dL — ABNORMAL HIGH (ref 70–99)
Potassium: 3.4 mmol/L — ABNORMAL LOW (ref 3.5–5.1)
Sodium: 128 mmol/L — ABNORMAL LOW (ref 135–145)
Total Bilirubin: 0.9 mg/dL (ref 0.3–1.2)
Total Protein: 6.7 g/dL (ref 6.5–8.1)

## 2018-12-17 LAB — CBC
HCT: 39.4 % (ref 39.0–52.0)
Hemoglobin: 13.7 g/dL (ref 13.0–17.0)
MCH: 30.9 pg (ref 26.0–34.0)
MCHC: 34.8 g/dL (ref 30.0–36.0)
MCV: 88.9 fL (ref 80.0–100.0)
Platelets: 247 10*3/uL (ref 150–400)
RBC: 4.43 MIL/uL (ref 4.22–5.81)
RDW: 12.6 % (ref 11.5–15.5)
WBC: 18.9 10*3/uL — ABNORMAL HIGH (ref 4.0–10.5)
nRBC: 0 % (ref 0.0–0.2)

## 2018-12-17 LAB — I-STAT CHEM 8, ED
BUN: 11 mg/dL (ref 6–20)
Calcium, Ion: 1.02 mmol/L — ABNORMAL LOW (ref 1.15–1.40)
Chloride: 97 mmol/L — ABNORMAL LOW (ref 98–111)
Creatinine, Ser: 1.3 mg/dL — ABNORMAL HIGH (ref 0.61–1.24)
Glucose, Bld: 109 mg/dL — ABNORMAL HIGH (ref 70–99)
HCT: 41 % (ref 39.0–52.0)
Hemoglobin: 13.9 g/dL (ref 13.0–17.0)
Potassium: 3.6 mmol/L (ref 3.5–5.1)
Sodium: 131 mmol/L — ABNORMAL LOW (ref 135–145)
TCO2: 20 mmol/L — ABNORMAL LOW (ref 22–32)

## 2018-12-17 LAB — SAMPLE TO BLOOD BANK

## 2018-12-17 LAB — CDS SEROLOGY

## 2018-12-17 LAB — PROTIME-INR
INR: 1.1 (ref 0.8–1.2)
Prothrombin Time: 13.7 seconds (ref 11.4–15.2)

## 2018-12-17 LAB — SARS CORONAVIRUS 2 BY RT PCR (HOSPITAL ORDER, PERFORMED IN ~~LOC~~ HOSPITAL LAB): SARS Coronavirus 2: NEGATIVE

## 2018-12-17 LAB — LACTIC ACID, PLASMA: Lactic Acid, Venous: 1.9 mmol/L (ref 0.5–1.9)

## 2018-12-17 LAB — ETHANOL: Alcohol, Ethyl (B): 223 mg/dL — ABNORMAL HIGH (ref <10)

## 2018-12-17 MED ORDER — SODIUM CHLORIDE 0.9 % IV SOLN
INTRAVENOUS | Status: AC | PRN
  Administered 2018-12-17: 1000 mL via INTRAVENOUS

## 2018-12-17 MED ORDER — HYDROMORPHONE HCL 1 MG/ML IJ SOLN
0.5000 mg | Freq: Once | INTRAMUSCULAR | Status: AC
  Administered 2018-12-17: 0.5 mg via INTRAVENOUS
  Filled 2018-12-17: qty 1

## 2018-12-17 MED ORDER — ONDANSETRON HCL 4 MG/2ML IJ SOLN
INTRAMUSCULAR | Status: AC
  Administered 2018-12-17: 4 mg via INTRAVENOUS
  Filled 2018-12-17: qty 2

## 2018-12-17 MED ORDER — IOHEXOL 300 MG/ML  SOLN
100.0000 mL | Freq: Once | INTRAMUSCULAR | Status: AC | PRN
  Administered 2018-12-17: 20:00:00 100 mL via INTRAVENOUS

## 2018-12-17 MED ORDER — DOCUSATE SODIUM 100 MG PO CAPS
100.0000 mg | ORAL_CAPSULE | Freq: Two times a day (BID) | ORAL | Status: DC
  Administered 2018-12-18 – 2018-12-23 (×7): 100 mg via ORAL
  Filled 2018-12-17 (×8): qty 1

## 2018-12-17 MED ORDER — MORPHINE SULFATE (PF) 4 MG/ML IV SOLN
INTRAVENOUS | Status: AC
  Filled 2018-12-17: qty 1

## 2018-12-17 MED ORDER — METHOCARBAMOL 500 MG PO TABS
500.0000 mg | ORAL_TABLET | Freq: Four times a day (QID) | ORAL | Status: DC | PRN
  Administered 2018-12-17 – 2018-12-19 (×3): 500 mg via ORAL
  Filled 2018-12-17 (×3): qty 1

## 2018-12-17 MED ORDER — HYDROMORPHONE HCL 1 MG/ML IJ SOLN
0.5000 mg | INTRAMUSCULAR | Status: DC | PRN
  Administered 2018-12-17 – 2018-12-18 (×2): 0.5 mg via INTRAVENOUS
  Filled 2018-12-17 (×3): qty 1

## 2018-12-17 MED ORDER — METOPROLOL TARTRATE 5 MG/5ML IV SOLN
5.0000 mg | Freq: Four times a day (QID) | INTRAVENOUS | Status: DC | PRN
  Administered 2018-12-21: 21:00:00 5 mg via INTRAVENOUS
  Filled 2018-12-17: qty 5

## 2018-12-17 MED ORDER — MORPHINE SULFATE (PF) 4 MG/ML IV SOLN
4.0000 mg | Freq: Once | INTRAVENOUS | Status: AC
  Administered 2018-12-17: 4 mg via INTRAVENOUS

## 2018-12-17 MED ORDER — OXYCODONE HCL 5 MG PO TABS: 5.0000 mg | ORAL_TABLET | ORAL | Status: DC | PRN

## 2018-12-17 MED ORDER — SODIUM CHLORIDE 0.9 % IV SOLN
INTRAVENOUS | Status: DC
  Administered 2018-12-17: via INTRAVENOUS

## 2018-12-17 MED ORDER — BISACODYL 10 MG RE SUPP
10.0000 mg | Freq: Every day | RECTAL | Status: DC | PRN
  Filled 2018-12-17: qty 1

## 2018-12-17 MED ORDER — ONDANSETRON 4 MG PO TBDP: 4.0000 mg | ORAL_TABLET | Freq: Four times a day (QID) | ORAL | Status: DC | PRN

## 2018-12-17 MED ORDER — SODIUM CHLORIDE 0.9 % IV BOLUS
1000.0000 mL | Freq: Once | INTRAVENOUS | Status: AC
  Administered 2018-12-17: 1000 mL via INTRAVENOUS

## 2018-12-17 MED ORDER — ONDANSETRON HCL 4 MG/2ML IJ SOLN
4.0000 mg | Freq: Once | INTRAMUSCULAR | Status: AC
  Administered 2018-12-17: 20:00:00 4 mg via INTRAVENOUS

## 2018-12-17 MED ORDER — PANTOPRAZOLE SODIUM 40 MG PO TBEC
40.0000 mg | DELAYED_RELEASE_TABLET | Freq: Every day | ORAL | Status: DC
  Administered 2018-12-18 – 2018-12-23 (×6): 40 mg via ORAL
  Filled 2018-12-17 (×6): qty 1

## 2018-12-17 MED ORDER — GABAPENTIN 300 MG PO CAPS
300.0000 mg | ORAL_CAPSULE | Freq: Three times a day (TID) | ORAL | Status: DC
  Administered 2018-12-18 – 2018-12-20 (×8): 300 mg via ORAL
  Filled 2018-12-17 (×9): qty 1

## 2018-12-17 MED ORDER — IBUPROFEN 200 MG PO TABS
800.0000 mg | ORAL_TABLET | Freq: Three times a day (TID) | ORAL | Status: DC
  Administered 2018-12-17: 800 mg via ORAL
  Filled 2018-12-17: qty 1

## 2018-12-17 MED ORDER — PANTOPRAZOLE SODIUM 40 MG IV SOLR: 40.0000 mg | Freq: Every day | INTRAVENOUS | Status: DC

## 2018-12-17 MED ORDER — FENTANYL CITRATE (PF) 100 MCG/2ML IJ SOLN
INTRAMUSCULAR | Status: AC | PRN
  Administered 2018-12-17: 50 ug via INTRAVENOUS

## 2018-12-17 MED ORDER — CEFAZOLIN SODIUM-DEXTROSE 1-4 GM/50ML-% IV SOLN
1.0000 g | Freq: Once | INTRAVENOUS | Status: AC
  Administered 2018-12-17: 20:00:00 1 g via INTRAVENOUS
  Filled 2018-12-17: qty 50

## 2018-12-17 MED ORDER — FENTANYL CITRATE (PF) 100 MCG/2ML IJ SOLN
INTRAMUSCULAR | Status: AC
  Filled 2018-12-17: qty 2

## 2018-12-17 MED ORDER — ONDANSETRON HCL 4 MG/2ML IJ SOLN: 4.0000 mg | Freq: Four times a day (QID) | INTRAMUSCULAR | Status: DC | PRN

## 2018-12-17 MED ORDER — ACETAMINOPHEN 325 MG PO TABS
650.0000 mg | ORAL_TABLET | Freq: Four times a day (QID) | ORAL | Status: DC
  Administered 2018-12-17 – 2018-12-18 (×2): 650 mg via ORAL
  Filled 2018-12-17 (×2): qty 2

## 2018-12-17 MED ORDER — HYDRALAZINE HCL 20 MG/ML IJ SOLN
10.0000 mg | INTRAMUSCULAR | Status: DC | PRN
  Administered 2018-12-18 – 2018-12-22 (×3): 10 mg via INTRAVENOUS
  Filled 2018-12-17 (×3): qty 1

## 2018-12-17 MED ORDER — TETANUS-DIPHTH-ACELL PERTUSSIS 5-2.5-18.5 LF-MCG/0.5 IM SUSP
0.5000 mL | Freq: Once | INTRAMUSCULAR | Status: AC
  Administered 2018-12-17: 20:00:00 0.5 mL via INTRAMUSCULAR
  Filled 2018-12-17: qty 0.5

## 2018-12-17 MED ORDER — MORPHINE SULFATE (PF) 4 MG/ML IV SOLN
4.0000 mg | Freq: Once | INTRAVENOUS | Status: AC
  Administered 2018-12-17: 4 mg via INTRAVENOUS
  Filled 2018-12-17: qty 1

## 2018-12-17 NOTE — ED Notes (Signed)
Assessment performed at 19:30
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
## 2018-12-17 NOTE — ED Notes (Signed)
[facility] here to transport pt,  
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
## 2018-12-17 NOTE — Progress Notes (Signed)
Orthopedic Tech Progress Note Patient Details:  Richard Ferguson 1975/12/21 [id number]  Ortho Devices Type of Ortho Device: Sugartong splint Ortho Device/Splint Location: lue Ortho Device/Splint Interventions: Ordered, Application, Adjustment   Post Interventions Patient Tolerated: Well Instructions Provided: Care of device, Adjustment of device   Karolee Stamps 12/17/2018, 11:18 PM
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
## 2018-12-17 NOTE — ED Provider Notes (Signed)
Patient transferred from [facility].  Mildly hypotensive but given no anemia or signs of severe blood loss on his trauma work-up, this is probably related to his multiple doses of pain meds.  Dr. Windle Guard is at the bedside and will admit.   Sherwood Gambler, MD 12/17/18 2249
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
## 2018-12-17 NOTE — ED Notes (Signed)
Report given to Santiago Glad RN at [facility] er

## 2018-12-17 NOTE — ED Provider Notes (Signed)
  1953 Temporarily assumed care of the patient.  Patient had a brief hypotensive reading with a systolic of 84 after receiving 0.5 mg of Dilaudid.  He was given 1 L of crystalloid, and laid flat.  Review of imaging demonstrates displaced distal radius and ulnar fracture.  He is neurovascularly intact currently.  He also has diastases of the pubic symphysis.  Pelvic binder applied, patient responded to 1 L of crystalloid and blood pressure was 458 systolic. Normal mentation.  Patient meeting level II activation criteria. Case discussed with Dr. Windle Guard of trauma surgery who is excepting at [facility].  Will transfer patient after he receives imaging.   2020 Case discussed with Dr. Griffin Basil of orthopedic surgery.  He is made aware of the distal radius and ulnar fractures, as well as the soft tissue injury of the left gastrocnemius. Dr. Doreatha Martin to address all orthopedic injuries and Dr. Griffin Basil will appraise Dr. Doreatha Martin of situation.  2024 Spoke with Dr. Verner Chol, ED attending physician at [facility] to make aware of patient.    Tamala Julian 12/17/18 2040    Hayden Rasmussen, MD 12/18/18 [phone]

## 2018-12-17 NOTE — ED Notes (Signed)
Blood bank and armband placed on R ankle

## 2018-12-18 ENCOUNTER — Inpatient Hospital Stay (HOSPITAL_COMMUNITY): Payer: Self-pay

## 2018-12-18 ENCOUNTER — Inpatient Hospital Stay (HOSPITAL_COMMUNITY): Admission: EM | Disposition: A | Discharge status: Rehabilitation Facility | Payer: Self-pay | Source: Home / Self Care

## 2018-12-18 ENCOUNTER — Inpatient Hospital Stay (HOSPITAL_COMMUNITY): Payer: Self-pay | Admitting: Certified Registered"

## 2018-12-18 HISTORY — PX: OPEN REDUCTION INTERNAL FIXATION (ORIF) DISTAL RADIAL FRACTURE: SHX5989

## 2018-12-18 HISTORY — PX: ORIF PELVIC FRACTURE: SHX2128

## 2018-12-18 LAB — BASIC METABOLIC PANEL
Anion gap: 6 (ref 5–15)
BUN: 10 mg/dL (ref 6–20)
CO2: 20 mmol/L — ABNORMAL LOW (ref 22–32)
Calcium: 7.2 mg/dL — ABNORMAL LOW (ref 8.9–10.3)
Chloride: 104 mmol/L (ref 98–111)
Creatinine, Ser: 0.88 mg/dL (ref 0.61–1.24)
GFR calc Af Amer: >60 mL/min (ref >60)
GFR calc non Af Amer: >60 mL/min (ref >60)
Glucose, Bld: 100 mg/dL — ABNORMAL HIGH (ref 70–99)
Potassium: 3.7 mmol/L (ref 3.5–5.1)
Sodium: 130 mmol/L — ABNORMAL LOW (ref 135–145)

## 2018-12-18 LAB — TYPE AND SCREEN
ABO/RH(D): A POS
Antibody Screen: NEGATIVE

## 2018-12-18 LAB — SURGICAL PCR SCREEN
MRSA, PCR: NEGATIVE
Staphylococcus aureus: POSITIVE — AB

## 2018-12-18 LAB — ABO/RH: ABO/RH(D): A POS

## 2018-12-18 LAB — CBC
HCT: 32.2 % — ABNORMAL LOW (ref 39.0–52.0)
Hemoglobin: 11.3 g/dL — ABNORMAL LOW (ref 13.0–17.0)
MCH: 31.5 pg (ref 26.0–34.0)
MCHC: 35.1 g/dL (ref 30.0–36.0)
MCV: 89.7 fL (ref 80.0–100.0)
Platelets: 174 10*3/uL (ref 150–400)
RBC: 3.59 MIL/uL — ABNORMAL LOW (ref 4.22–5.81)
RDW: 12.6 % (ref 11.5–15.5)
WBC: 17.8 10*3/uL — ABNORMAL HIGH (ref 4.0–10.5)
nRBC: 0 % (ref 0.0–0.2)

## 2018-12-18 LAB — HIV ANTIBODY (ROUTINE TESTING W REFLEX): HIV Screen 4th Generation wRfx: NONREACTIVE

## 2018-12-18 SURGERY — OPEN REDUCTION INTERNAL FIXATION (ORIF) PELVIC FRACTURE
Anesthesia: General

## 2018-12-18 MED ORDER — ROCURONIUM BROMIDE 10 MG/ML (PF) SYRINGE
PREFILLED_SYRINGE | INTRAVENOUS | Status: AC
  Filled 2018-12-18: qty 10

## 2018-12-18 MED ORDER — KETAMINE HCL 50 MG/5ML IJ SOSY
PREFILLED_SYRINGE | INTRAMUSCULAR | Status: AC
  Filled 2018-12-18: qty 5

## 2018-12-18 MED ORDER — ONDANSETRON HCL 4 MG/2ML IJ SOLN
INTRAMUSCULAR | Status: DC | PRN
  Administered 2018-12-18: 4 mg via INTRAVENOUS

## 2018-12-18 MED ORDER — CEFAZOLIN SODIUM 1 G IJ SOLR
INTRAMUSCULAR | Status: AC
  Filled 2018-12-18: qty 20

## 2018-12-18 MED ORDER — ROCURONIUM BROMIDE 10 MG/ML (PF) SYRINGE
PREFILLED_SYRINGE | INTRAVENOUS | Status: DC | PRN
  Administered 2018-12-18: 20 mg via INTRAVENOUS
  Administered 2018-12-18: 60 mg via INTRAVENOUS
  Administered 2018-12-18: 20 mg via INTRAVENOUS
  Administered 2018-12-18: 30 mg via INTRAVENOUS
  Administered 2018-12-18: 10 mg via INTRAVENOUS
  Administered 2018-12-18: 40 mg via INTRAVENOUS
  Administered 2018-12-18: 10 mg via INTRAVENOUS
  Administered 2018-12-18: 30 mg via INTRAVENOUS
  Administered 2018-12-18: 20 mg via INTRAVENOUS
  Administered 2018-12-18: 10 mg via INTRAVENOUS

## 2018-12-18 MED ORDER — ONDANSETRON HCL 4 MG/2ML IJ SOLN
INTRAMUSCULAR | Status: AC
  Filled 2018-12-18: qty 2

## 2018-12-18 MED ORDER — IPRATROPIUM-ALBUTEROL 0.5-2.5 (3) MG/3ML IN SOLN: 3.0000 mL | RESPIRATORY_TRACT | Status: DC | PRN

## 2018-12-18 MED ORDER — KETAMINE HCL 10 MG/ML IJ SOLN
INTRAMUSCULAR | Status: DC | PRN
  Administered 2018-12-18 (×2): 10 mg via INTRAVENOUS
  Administered 2018-12-18: 20 mg via INTRAVENOUS
  Administered 2018-12-18 (×4): 10 mg via INTRAVENOUS
  Administered 2018-12-18: 20 mg via INTRAVENOUS

## 2018-12-18 MED ORDER — LACTATED RINGERS IV SOLN
INTRAVENOUS | Status: DC
  Administered 2018-12-18 (×3): via INTRAVENOUS

## 2018-12-18 MED ORDER — TOBRAMYCIN SULFATE 1.2 G IJ SOLR
INTRAMUSCULAR | Status: DC | PRN
  Administered 2018-12-18: 1.2 g via TOPICAL

## 2018-12-18 MED ORDER — OXYCODONE HCL 5 MG PO TABS
5.0000 mg | ORAL_TABLET | ORAL | Status: DC | PRN
  Administered 2018-12-20 – 2018-12-22 (×2): 10 mg via ORAL
  Filled 2018-12-18 (×3): qty 2

## 2018-12-18 MED ORDER — LIDOCAINE 2% (20 MG/ML) 5 ML SYRINGE
INTRAMUSCULAR | Status: DC | PRN
  Administered 2018-12-18: 100 mg via INTRAVENOUS

## 2018-12-18 MED ORDER — SUCCINYLCHOLINE CHLORIDE 200 MG/10ML IV SOSY
PREFILLED_SYRINGE | INTRAVENOUS | Status: AC
  Filled 2018-12-18: qty 10

## 2018-12-18 MED ORDER — DEXAMETHASONE SODIUM PHOSPHATE 10 MG/ML IJ SOLN
INTRAMUSCULAR | Status: AC
  Filled 2018-12-18: qty 1

## 2018-12-18 MED ORDER — ENOXAPARIN SODIUM 40 MG/0.4ML ~~LOC~~ SOLN
40.0000 mg | SUBCUTANEOUS | Status: DC
  Administered 2018-12-19 – 2018-12-23 (×5): 40 mg via SUBCUTANEOUS
  Filled 2018-12-18 (×5): qty 0.4

## 2018-12-18 MED ORDER — ALBUTEROL SULFATE HFA 108 (90 BASE) MCG/ACT IN AERS
INHALATION_SPRAY | RESPIRATORY_TRACT | Status: DC | PRN
  Administered 2018-12-18: 2 via RESPIRATORY_TRACT
  Administered 2018-12-18: 6 via RESPIRATORY_TRACT

## 2018-12-18 MED ORDER — ALBUMIN HUMAN 5 % IV SOLN
INTRAVENOUS | Status: DC | PRN
  Administered 2018-12-18 (×2): via INTRAVENOUS

## 2018-12-18 MED ORDER — ROCURONIUM BROMIDE 10 MG/ML (PF) SYRINGE
PREFILLED_SYRINGE | INTRAVENOUS | Status: AC
  Filled 2018-12-18: qty 20

## 2018-12-18 MED ORDER — ARTIFICIAL TEARS OPHTHALMIC OINT
TOPICAL_OINTMENT | OPHTHALMIC | Status: AC
  Filled 2018-12-18: qty 3.5

## 2018-12-18 MED ORDER — ACETAMINOPHEN 500 MG PO TABS
1000.0000 mg | ORAL_TABLET | Freq: Four times a day (QID) | ORAL | Status: DC
  Administered 2018-12-18 – 2018-12-23 (×19): 1000 mg via ORAL
  Filled 2018-12-18 (×19): qty 2

## 2018-12-18 MED ORDER — CHLORHEXIDINE GLUCONATE CLOTH 2 % EX PADS
6.0000 | MEDICATED_PAD | Freq: Every day | CUTANEOUS | Status: AC
  Administered 2018-12-19 – 2018-12-22 (×4): 6 via TOPICAL

## 2018-12-18 MED ORDER — FENTANYL CITRATE (PF) 250 MCG/5ML IJ SOLN
INTRAMUSCULAR | Status: AC
  Filled 2018-12-18: qty 5

## 2018-12-18 MED ORDER — FENTANYL CITRATE (PF) 100 MCG/2ML IJ SOLN
INTRAMUSCULAR | Status: AC
  Filled 2018-12-18: qty 2

## 2018-12-18 MED ORDER — LIDOCAINE 2% (20 MG/ML) 5 ML SYRINGE
INTRAMUSCULAR | Status: AC
  Filled 2018-12-18: qty 5

## 2018-12-18 MED ORDER — MIDAZOLAM HCL 5 MG/5ML IJ SOLN
INTRAMUSCULAR | Status: DC | PRN
  Administered 2018-12-18: 2 mg via INTRAVENOUS

## 2018-12-18 MED ORDER — BACITRACIN ZINC 500 UNIT/GM EX OINT
TOPICAL_OINTMENT | CUTANEOUS | Status: AC
  Filled 2018-12-18: qty 28.35

## 2018-12-18 MED ORDER — CEFAZOLIN SODIUM-DEXTROSE 2-4 GM/100ML-% IV SOLN
2.0000 g | Freq: Three times a day (TID) | INTRAVENOUS | Status: AC
  Administered 2018-12-18 – 2018-12-19 (×3): 2 g via INTRAVENOUS
  Filled 2018-12-18 (×3): qty 100

## 2018-12-18 MED ORDER — IPRATROPIUM-ALBUTEROL 0.5-2.5 (3) MG/3ML IN SOLN
3.0000 mL | Freq: Four times a day (QID) | RESPIRATORY_TRACT | Status: DC
  Administered 2018-12-18: 3 mL via RESPIRATORY_TRACT
  Filled 2018-12-18: qty 3

## 2018-12-18 MED ORDER — PHENYLEPHRINE 40 MCG/ML (10ML) SYRINGE FOR IV PUSH (FOR BLOOD PRESSURE SUPPORT)
PREFILLED_SYRINGE | INTRAVENOUS | Status: AC
  Filled 2018-12-18: qty 10

## 2018-12-18 MED ORDER — SUGAMMADEX SODIUM 200 MG/2ML IV SOLN
INTRAVENOUS | Status: DC | PRN
  Administered 2018-12-18: 250 mg via INTRAVENOUS

## 2018-12-18 MED ORDER — EPHEDRINE SULFATE-NACL 50-0.9 MG/10ML-% IV SOSY
PREFILLED_SYRINGE | INTRAVENOUS | Status: DC | PRN
  Administered 2018-12-18: 10 mg via INTRAVENOUS
  Administered 2018-12-18: 5 mg via INTRAVENOUS

## 2018-12-18 MED ORDER — AMLODIPINE BESYLATE 10 MG PO TABS
10.0000 mg | ORAL_TABLET | Freq: Every day | ORAL | Status: DC
  Administered 2018-12-19 – 2018-12-23 (×5): 10 mg via ORAL
  Filled 2018-12-18 (×5): qty 1

## 2018-12-18 MED ORDER — EPHEDRINE 5 MG/ML INJ
INTRAVENOUS | Status: AC
  Filled 2018-12-18: qty 10

## 2018-12-18 MED ORDER — FENTANYL CITRATE (PF) 250 MCG/5ML IJ SOLN
INTRAMUSCULAR | Status: DC | PRN
  Administered 2018-12-18 (×2): 50 ug via INTRAVENOUS
  Administered 2018-12-18: 100 ug via INTRAVENOUS
  Administered 2018-12-18 (×5): 50 ug via INTRAVENOUS

## 2018-12-18 MED ORDER — VANCOMYCIN HCL 1000 MG IV SOLR
INTRAVENOUS | Status: AC
  Filled 2018-12-18: qty 3000

## 2018-12-18 MED ORDER — DEXMEDETOMIDINE HCL IN NACL 200 MCG/50ML IV SOLN
INTRAVENOUS | Status: AC
  Filled 2018-12-18: qty 50

## 2018-12-18 MED ORDER — HYDROMORPHONE HCL 1 MG/ML IJ SOLN
0.5000 mg | INTRAMUSCULAR | Status: DC | PRN
  Administered 2018-12-18 (×2): 2 mg via INTRAVENOUS
  Administered 2018-12-18: 0.5 mg via INTRAVENOUS
  Administered 2018-12-19: 11:00:00 1 mg via INTRAVENOUS
  Administered 2018-12-19: 02:00:00 2 mg via INTRAVENOUS
  Administered 2018-12-19: 1 mg via INTRAVENOUS
  Administered 2018-12-19: 06:00:00 2 mg via INTRAVENOUS
  Administered 2018-12-20 – 2018-12-21 (×5): 1 mg via INTRAVENOUS
  Filled 2018-12-18 (×4): qty 1
  Filled 2018-12-18 (×4): qty 2
  Filled 2018-12-18: qty 1
  Filled 2018-12-18: qty 2
  Filled 2018-12-18 (×2): qty 1

## 2018-12-18 MED ORDER — FENTANYL CITRATE (PF) 100 MCG/2ML IJ SOLN
25.0000 ug | INTRAMUSCULAR | Status: DC | PRN
  Administered 2018-12-18 (×5): 25 ug via INTRAVENOUS

## 2018-12-18 MED ORDER — SUCCINYLCHOLINE CHLORIDE 200 MG/10ML IV SOSY
PREFILLED_SYRINGE | INTRAVENOUS | Status: DC | PRN
  Administered 2018-12-18: 140 mg via INTRAVENOUS

## 2018-12-18 MED ORDER — PHENYLEPHRINE 40 MCG/ML (10ML) SYRINGE FOR IV PUSH (FOR BLOOD PRESSURE SUPPORT)
PREFILLED_SYRINGE | INTRAVENOUS | Status: DC | PRN
  Administered 2018-12-18: 80 ug via INTRAVENOUS
  Administered 2018-12-18: 40 ug via INTRAVENOUS
  Administered 2018-12-18: 80 ug via INTRAVENOUS

## 2018-12-18 MED ORDER — DEXAMETHASONE SODIUM PHOSPHATE 10 MG/ML IJ SOLN
INTRAMUSCULAR | Status: DC | PRN
  Administered 2018-12-18: 10 mg via INTRAVENOUS

## 2018-12-18 MED ORDER — MIDAZOLAM HCL 2 MG/2ML IJ SOLN
INTRAMUSCULAR | Status: AC
  Filled 2018-12-18: qty 2

## 2018-12-18 MED ORDER — CEFAZOLIN SODIUM-DEXTROSE 2-3 GM-%(50ML) IV SOLR
INTRAVENOUS | Status: DC | PRN
  Administered 2018-12-18 (×2): 2 g via INTRAVENOUS

## 2018-12-18 MED ORDER — OXYCODONE HCL 5 MG PO TABS
10.0000 mg | ORAL_TABLET | ORAL | Status: DC | PRN
  Administered 2018-12-19 – 2018-12-21 (×9): 15 mg via ORAL
  Filled 2018-12-18 (×3): qty 3
  Filled 2018-12-18: qty 2
  Filled 2018-12-18 (×6): qty 3

## 2018-12-18 MED ORDER — DEXMEDETOMIDINE HCL 200 MCG/2ML IV SOLN
INTRAVENOUS | Status: DC | PRN
  Administered 2018-12-18 (×2): 8 ug via INTRAVENOUS

## 2018-12-18 MED ORDER — MUPIROCIN 2 % EX OINT
1.0000 "application " | TOPICAL_OINTMENT | Freq: Two times a day (BID) | CUTANEOUS | Status: DC
  Administered 2018-12-18 – 2018-12-22 (×9): 1 via NASAL
  Filled 2018-12-18 (×3): qty 22

## 2018-12-18 MED ORDER — NICOTINE 7 MG/24HR TD PT24
7.0000 mg | MEDICATED_PATCH | Freq: Every day | TRANSDERMAL | Status: DC
  Administered 2018-12-18 – 2018-12-23 (×6): 7 mg via TRANSDERMAL
  Filled 2018-12-18 (×6): qty 1

## 2018-12-18 MED ORDER — 0.9 % SODIUM CHLORIDE (POUR BTL) OPTIME
TOPICAL | Status: DC | PRN
  Administered 2018-12-18: 1000 mL

## 2018-12-18 MED ORDER — PROPOFOL 10 MG/ML IV BOLUS
INTRAVENOUS | Status: DC | PRN
  Administered 2018-12-18 (×3): 20 mg via INTRAVENOUS
  Administered 2018-12-18: 200 mg via INTRAVENOUS

## 2018-12-18 MED ORDER — TOBRAMYCIN SULFATE 1.2 G IJ SOLR
INTRAMUSCULAR | Status: AC
  Filled 2018-12-18: qty 1.2

## 2018-12-18 MED ORDER — PROMETHAZINE HCL 25 MG/ML IJ SOLN: 6.2500 mg | INTRAMUSCULAR | Status: DC | PRN

## 2018-12-18 MED ORDER — PROPOFOL 10 MG/ML IV BOLUS
INTRAVENOUS | Status: AC
  Filled 2018-12-18: qty 20

## 2018-12-18 MED ORDER — VANCOMYCIN HCL 1000 MG IV SOLR
INTRAVENOUS | Status: DC | PRN
  Administered 2018-12-18 (×3): 1000 mg via TOPICAL

## 2018-12-18 MED ORDER — VANCOMYCIN HCL 1000 MG IV SOLR
INTRAVENOUS | Status: AC
  Filled 2018-12-18: qty 1000

## 2018-12-18 SURGICAL SUPPLY — 105 items
BIT DRILL 2.0 HCS 150 (BIT) ×1 IMPLANT
BIT DRILL CALIBRATED 1.8MM (BIT) IMPLANT
BIT DRILL CANN 4.5MM (BIT) IMPLANT
BIT DRILL FLUTED 2.5 (BIT) IMPLANT
BIT DRILL LCP QC 2X140 (BIT) ×1 IMPLANT
BIT DRILL QC 2X125 (BIT) IMPLANT
BLADE CLIPPER SURG (BLADE) ×2 IMPLANT
BNDG ELASTIC 3X5.8 VLCR STR LF (GAUZE/BANDAGES/DRESSINGS) ×2 IMPLANT
BNDG GAUZE ELAST 4 BULKY (GAUZE/BANDAGES/DRESSINGS) ×1 IMPLANT
CHLORAPREP W/TINT 26 (MISCELLANEOUS) ×4 IMPLANT
CLSR STERI-STRIP ANTIMIC 1/2X4 (GAUZE/BANDAGES/DRESSINGS) ×3 IMPLANT
COVER WAND RF STERILE (DRAPES) ×2 IMPLANT
DERMABOND ADVANCED (GAUZE/BANDAGES/DRESSINGS)
DERMABOND ADVANCED .7 DNX12 (GAUZE/BANDAGES/DRESSINGS) ×4 IMPLANT
DRAIN CHANNEL 15F RND FF W/TCR (WOUND CARE) IMPLANT
DRAPE C-ARM 42X72 X-RAY (DRAPES) ×4 IMPLANT
DRAPE C-ARMOR (DRAPES) ×3 IMPLANT
DRAPE EXTREMITY T 121X128X90 (DISPOSABLE) ×2 IMPLANT
DRAPE HALF SHEET 40X57 (DRAPES) ×6 IMPLANT
DRAPE INCISE IOBAN 66X45 STRL (DRAPES) ×7 IMPLANT
DRAPE SURG 17X23 STRL (DRAPES) ×18 IMPLANT
DRAPE U-SHAPE 47X51 STRL (DRAPES) ×3 IMPLANT
DRILL BIT CANN 4.5MM (BIT) ×1
DRILL BIT FLUTED 2.5 (BIT) ×3
DRILL BIT QC 2X125 (BIT) ×1
DRILL CALIBRATED 1.8MM (BIT) ×3
DRSG ADAPTIC 3X8 NADH LF (GAUZE/BANDAGES/DRESSINGS) ×1 IMPLANT
DRSG MEPILEX BORDER 4X4 (GAUZE/BANDAGES/DRESSINGS) ×6 IMPLANT
DRSG MEPILEX BORDER 4X8 (GAUZE/BANDAGES/DRESSINGS) ×5 IMPLANT
ELECT REM PT RETURN 9FT ADLT (ELECTROSURGICAL) ×3
ELECTRODE REM PT RTRN 9FT ADLT (ELECTROSURGICAL) ×2 IMPLANT
EVACUATOR SILICONE 100CC (DRAIN) IMPLANT
GAUZE SPONGE 4X4 12PLY STRL LF (GAUZE/BANDAGES/DRESSINGS) ×3 IMPLANT
GLOVE BIO SURGEON STRL SZ 6.5 (GLOVE) ×10 IMPLANT
GLOVE BIO SURGEON STRL SZ7.5 (GLOVE) ×14 IMPLANT
GLOVE BIOGEL PI IND STRL 6.5 (GLOVE) ×2 IMPLANT
GLOVE BIOGEL PI IND STRL 7.5 (GLOVE) ×2 IMPLANT
GLOVE BIOGEL PI INDICATOR 6.5 (GLOVE) ×2
GLOVE BIOGEL PI INDICATOR 7.5 (GLOVE) ×2
GOWN STRL REUS W/ TWL LRG LVL3 (GOWN DISPOSABLE) ×4 IMPLANT
GOWN STRL REUS W/TWL LRG LVL3 (GOWN DISPOSABLE) ×6
GUIDEWIRE 2.0MM (WIRE) ×2 IMPLANT
GUIDEWIRE THREADED 2.8MM (WIRE) ×2 IMPLANT
K-WIRE 1.25 TRCR POINT 150 (WIRE) ×3
K-WIRE 1.6X150 (WIRE) ×3
K-WIRE THRD 2X13 (WIRE) ×3
KIT BASIN OR (CUSTOM PROCEDURE TRAY) ×3 IMPLANT
KIT TURNOVER KIT B (KITS) ×3 IMPLANT
KWIRE 1.25 TRCR POINT 150 (WIRE) IMPLANT
KWIRE 1.6X150 (WIRE) IMPLANT
KWIRE THRD 2X13 (WIRE) IMPLANT
MANIFOLD NEPTUNE II (INSTRUMENTS) ×3 IMPLANT
NS IRRIG 1000ML POUR BTL (IV SOLUTION) ×6 IMPLANT
PACK TOTAL JOINT (CUSTOM PROCEDURE TRAY) ×3 IMPLANT
PACK UNIVERSAL I (CUSTOM PROCEDURE TRAY) ×3 IMPLANT
PAD ARMBOARD 7.5X6 YLW CONV (MISCELLANEOUS) ×6 IMPLANT
PAD CAST 4YDX4 CTTN HI CHSV (CAST SUPPLIES) IMPLANT
PADDING CAST COTTON 4X4 STRL (CAST SUPPLIES) ×2
PLATE 2.7 LCP T 2HOLES HEAD (Plate) ×1 IMPLANT
PLATE PUBLIC SYMPHOSIS 3.5 (Plate) ×1 IMPLANT
PLATE VA DRP VOLAR 2.4X3H/7H (Plate) ×2 IMPLANT
SCREW BONE CANN 7.3X90 S/D (Screw) ×1 IMPLANT
SCREW CANN SD SJF 3X28 (Screw) ×1 IMPLANT
SCREW CANN VA-LCP 2.428 (Screw) ×1 IMPLANT
SCREW CORT 3.5X25 (Screw) ×1 IMPLANT
SCREW CORT ST T8 SD REC 2.7X20 (Screw) ×1 IMPLANT
SCREW CORTEX 2.7 28MM (Screw) ×3 IMPLANT
SCREW CORTEX 2.7 SLF-TPNG 16MM (Screw) ×4 IMPLANT
SCREW CORTEX 3.5 28MM (Screw) ×1 IMPLANT
SCREW CORTEX 3.5 32MM (Screw) ×1 IMPLANT
SCREW CORTEX SLFTPNG 28MM 2.4 (Screw) ×1 IMPLANT
SCREW LOCK CANN FT 7.3X135 (Screw) ×1 IMPLANT
SCREW LOCK CORT ST 3.5X28 (Screw) IMPLANT
SCREW LOCK CORT ST 3.5X32 (Screw) IMPLANT
SCREW LOCK ST 2.7X16 (Screw) ×1 IMPLANT
SCREW LOCK ST 2.7X18 (Screw) ×2 IMPLANT
SCREW LOCK T8 18X2.7X2.1XST (Screw) IMPLANT
SCREW LOCK T8 20X2.4XSTVA (Screw) IMPLANT
SCREW LOCKING 2.4X20 (Screw) ×1 IMPLANT
SCREW LOCKING 2.4X22 (Screw) ×2 IMPLANT
SCREW LOCKING 2.4X24MM (Screw) ×4 IMPLANT
SCREW LOCKING 2.4X26MM (Screw) ×5 IMPLANT
SCREW PELVIC CORT ST 3.5X55 (Screw) IMPLANT
SCREW PELVIC CORT ST 3.5X70 (Screw) IMPLANT
SCREW SELF TAP 3.5 55M (Screw) ×2 IMPLANT
SCREW SELF TAP 3.5 70M (Screw) ×1 IMPLANT
SPLINT PLASTER CAST XFAST 5X30 (CAST SUPPLIES) IMPLANT
SPLINT PLASTER XFAST SET 5X30 (CAST SUPPLIES) ×2
SPONGE LAP 18X18 RF (DISPOSABLE) ×1 IMPLANT
STAPLER VISISTAT 35W (STAPLE) ×4 IMPLANT
SUCTION FRAZIER HANDLE 10FR (MISCELLANEOUS) ×2
SUCTION TUBE FRAZIER 10FR DISP (MISCELLANEOUS) ×2 IMPLANT
SUT MNCRL AB 3-0 PS2 18 (SUTURE) ×5 IMPLANT
SUT MON AB 2-0 CT1 36 (SUTURE) ×2 IMPLANT
SUT VIC AB 0 CT1 27 (SUTURE) ×2
SUT VIC AB 0 CT1 27XBRD ANBCTR (SUTURE) ×4 IMPLANT
SUT VIC AB 1 CT1 18XCR BRD 8 (SUTURE) IMPLANT
SUT VIC AB 1 CT1 8-18 (SUTURE)
SUT VIC AB 2-0 CT1 27 (SUTURE) ×3
SUT VIC AB 2-0 CT1 TAPERPNT 27 (SUTURE) ×4 IMPLANT
TOWEL GREEN STERILE (TOWEL DISPOSABLE) ×6 IMPLANT
TOWEL GREEN STERILE FF (TOWEL DISPOSABLE) ×4 IMPLANT
TRAY FOLEY MTR SLVR 16FR STAT (SET/KITS/TRAYS/PACK) ×1 IMPLANT
WASHER FOR 5.0 SCREWS (Washer) ×2 IMPLANT
WATER STERILE IRR 1000ML POUR (IV SOLUTION) ×8 IMPLANT
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
## 2018-12-18 NOTE — Transfer of Care (Signed)
Immediate Anesthesia Transfer of Care Note  Patient: Richard Ferguson  Procedure(s) Performed: OPEN REDUCTION INTERNAL FIXATION (ORIF) PELVIC FRACTURE (N/A ) OPEN REDUCTION INTERNAL FIXATION (ORIF) DISTAL RADIAL FRACTURE (Bilateral )  Patient Location: PACU  Anesthesia Type:General  Level of Consciousness: drowsy, patient cooperative and responds to stimulation  Airway & Oxygen Therapy: Patient Spontanous Breathing and Patient connected to face mask oxygen  Post-op Assessment: Report given to RN and Post -op Vital signs reviewed and stable  Post vital signs: Reviewed and stable  Last Vitals:  Vitals Value Taken Time  BP 157/64 12/18/18 1716  Temp    Pulse 87   Resp 17 12/18/18 1718  SpO2 100   Vitals shown include unvalidated device data.  Last Pain:  Vitals:   12/18/18 0820  TempSrc: Oral  PainSc:          Complications: No apparent anesthesia complications

## 2018-12-18 NOTE — Anesthesia Preprocedure Evaluation (Signed)
Anesthesia Evaluation  Patient identified by MRN, date of birth, ID band Patient awake    Reviewed: Allergy & Precautions, NPO status , Patient's Chart, lab work & pertinent test results  Airway Mallampati: II  TM Distance: >3 FB     Dental  (+) Dental Advisory Given   Pulmonary COPD, Current Smoker,    breath sounds clear to auscultation       Cardiovascular hypertension, Pt. on medications  Rhythm:Regular Rate:Normal     Neuro/Psych negative neurological ROS     GI/Hepatic negative GI ROS, Neg liver ROS,   Endo/Other  negative endocrine ROS  Renal/GU negative Renal ROS     Musculoskeletal   Abdominal   Peds  Hematology negative hematology ROS (+)   Anesthesia Other Findings   Reproductive/Obstetrics                             Lab Results  Component Value Date   WBC 17.8 (H) 12/18/2018   HGB 11.3 (L) 12/18/2018   HCT 32.2 (L) 12/18/2018   MCV 89.7 12/18/2018   PLT 174 12/18/2018   Lab Results  Component Value Date   CREATININE 0.88 12/18/2018   BUN 10 12/18/2018   NA 130 (L) 12/18/2018   K 3.7 12/18/2018   CL 104 12/18/2018   CO2 20 (L) 12/18/2018    Anesthesia Physical Anesthesia Plan  ASA: II  Anesthesia Plan: General   Post-op Pain Management:    Induction: Intravenous  PONV Risk Score and Plan: 1 and Dexamethasone, Ondansetron and Treatment may vary due to age or medical condition  Airway Management Planned: Oral ETT  Additional Equipment:   Intra-op Plan:   Post-operative Plan: Extubation in OR  Informed Consent: I have reviewed the patients History and Physical, chart, labs and discussed the procedure including the risks, benefits and alternatives for the proposed anesthesia with the patient or authorized representative who has indicated his/her understanding and acceptance.     Dental advisory given  Plan Discussed with:   Anesthesia Plan  Comments:         Anesthesia Quick Evaluation

## 2018-12-18 NOTE — Anesthesia Procedure Notes (Signed)
Procedure Name: Intubation Date/Time: 12/18/2018 10:09 AM Performed by: Imagene Riches, CRNA Pre-anesthesia Checklist: Patient identified, Emergency Drugs available, Suction available and Patient being monitored Patient Re-evaluated:Patient Re-evaluated prior to induction Oxygen Delivery Method: Circle System Utilized Preoxygenation: Pre-oxygenation with 100% oxygen Induction Type: IV induction Laryngoscope Size: Glidescope and 4 Grade View: Grade I Tube type: Oral Tube size: 7.5 mm Number of attempts: 1 Airway Equipment and Method: Stylet and Oral airway Placement Confirmation: ETT inserted through vocal cords under direct vision,  positive ETCO2 and breath sounds checked- equal and bilateral Secured at: 23 cm Tube secured with: Tape Dental Injury: Teeth and Oropharynx as per pre-operative assessment
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
## 2018-12-19 LAB — BASIC METABOLIC PANEL
Anion gap: 7 (ref 5–15)
BUN: 10 mg/dL (ref 6–20)
CO2: 22 mmol/L (ref 22–32)
Calcium: 8 mg/dL — ABNORMAL LOW (ref 8.9–10.3)
Chloride: 105 mmol/L (ref 98–111)
Creatinine, Ser: 0.85 mg/dL (ref 0.61–1.24)
GFR calc Af Amer: >60 mL/min (ref >60)
GFR calc non Af Amer: >60 mL/min (ref >60)
Glucose, Bld: 118 mg/dL — ABNORMAL HIGH (ref 70–99)
Potassium: 4.1 mmol/L (ref 3.5–5.1)
Sodium: 134 mmol/L — ABNORMAL LOW (ref 135–145)

## 2018-12-19 LAB — CBC
HCT: 27.2 % — ABNORMAL LOW (ref 39.0–52.0)
Hemoglobin: 9.3 g/dL — ABNORMAL LOW (ref 13.0–17.0)
MCH: 32 pg (ref 26.0–34.0)
MCHC: 34.2 g/dL (ref 30.0–36.0)
MCV: 93.5 fL (ref 80.0–100.0)
Platelets: 154 10*3/uL (ref 150–400)
RBC: 2.91 MIL/uL — ABNORMAL LOW (ref 4.22–5.81)
RDW: 12.8 % (ref 11.5–15.5)
WBC: 22 10*3/uL — ABNORMAL HIGH (ref 4.0–10.5)
nRBC: 0 % (ref 0.0–0.2)

## 2018-12-19 MED ORDER — POLYETHYLENE GLYCOL 3350 17 G PO PACK
17.0000 g | PACK | Freq: Every day | ORAL | Status: DC
  Administered 2018-12-19 – 2018-12-21 (×3): 17 g via ORAL
  Filled 2018-12-19 (×4): qty 1

## 2018-12-19 MED ORDER — METHOCARBAMOL 750 MG PO TABS
750.0000 mg | ORAL_TABLET | Freq: Three times a day (TID) | ORAL | Status: DC
  Administered 2018-12-19 – 2018-12-23 (×12): 750 mg via ORAL
  Filled 2018-12-19 (×13): qty 1

## 2018-12-19 MED FILL — Ondansetron HCl Inj 4 MG/2ML (2 MG/ML): INTRAMUSCULAR | Qty: 2 | Status: AC
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
## 2018-12-19 NOTE — Anesthesia Postprocedure Evaluation (Signed)
Anesthesia Post Note  Patient: DARREON LUTES  Procedure(s) Performed: OPEN REDUCTION INTERNAL FIXATION (ORIF) PELVIC FRACTURE (N/A ) OPEN REDUCTION INTERNAL FIXATION (ORIF) DISTAL RADIAL FRACTURE (Bilateral )     Patient location during evaluation: PACU Anesthesia Type: General Level of consciousness: awake and patient cooperative Pain management: pain level controlled Vital Signs Assessment: post-procedure vital signs reviewed and stable Respiratory status: spontaneous breathing, nonlabored ventilation, respiratory function stable and patient connected to nasal cannula oxygen Cardiovascular status: blood pressure returned to baseline and stable Postop Assessment: no apparent nausea or vomiting Anesthetic complications: no    Last Vitals:  Vitals:   12/19/18 0426 12/19/18 0829  BP:  (!) 149/97  Pulse: 99 (!) 105  Resp: 12 16  Temp: 36.8 C 37.5 C  SpO2: 96% 94%    Last Pain:  Vitals:   12/19/18 1016  TempSrc:   PainSc: 10-Worst pain ever                 Chantilly Linskey
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
## 2018-12-19 NOTE — Progress Notes (Signed)
Rehab Admissions Coordinator Note:  Patient was screened by Cleatrice Burke for appropriateness for an Inpatient Acute Rehab Consult per OT recs.  At this time, we are recommending Inpatient Rehab consult if pt would like to be considered for admit. Please advise.  Cleatrice Burke RN MSN 12/19/2018, 1:42 PM  I can be reached at [phone].

## 2018-12-19 NOTE — Plan of Care (Signed)

## 2018-12-20 ENCOUNTER — Encounter (HOSPITAL_COMMUNITY): Payer: Self-pay | Admitting: Student

## 2018-12-20 LAB — CBC
HCT: 24.6 % — ABNORMAL LOW (ref 39.0–52.0)
Hemoglobin: 8.1 g/dL — ABNORMAL LOW (ref 13.0–17.0)
MCH: 31.2 pg (ref 26.0–34.0)
MCHC: 32.9 g/dL (ref 30.0–36.0)
MCV: 94.6 fL (ref 80.0–100.0)
Platelets: 113 10*3/uL — ABNORMAL LOW (ref 150–400)
RBC: 2.6 MIL/uL — ABNORMAL LOW (ref 4.22–5.81)
RDW: 12.9 % (ref 11.5–15.5)
WBC: 11.1 10*3/uL — ABNORMAL HIGH (ref 4.0–10.5)
nRBC: 0 % (ref 0.0–0.2)

## 2018-12-20 LAB — VITAMIN D 25 HYDROXY (VIT D DEFICIENCY, FRACTURES): Vit D, 25-Hydroxy: 22.7 ng/mL — ABNORMAL LOW (ref 30.0–100.0)
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
## 2018-12-20 NOTE — Progress Notes (Signed)
Inpatient Rehab Admissions:  Inpatient Rehab Consult received.  I met with pt at the bedside for rehabilitation assessment and to discuss goals and expectations of an inpatient rehab admission. Pt very interested in the program and asking appropriate questions. AC discussed the estimated daily cost of care, anticipated length of stay, and anticipated level of assistance required for a safe return home after CIR. With permission, AC spoke with pt's wife and daughter to confirm caregiver support and update them on recommended program. The pt and his family would like to discuss details tonight and will get back to [facility] tomorrow with decision.   AC will follow for rehab venue preference as well as medical readiness. Pt still receiving IV dilaudid for pain control.   Jhonnie Garner, OTR/L  Rehab Admissions Coordinator  [phone] 12/20/2018 2:38 PM

## 2018-12-21 DIAGNOSIS — S62021A Displaced fracture of middle third of navicular [scaphoid] bone of right wrist, initial encounter for closed fracture: Secondary | ICD-10-CM

## 2018-12-21 DIAGNOSIS — S62131A Displaced fracture of capitate [os magnum] bone, right wrist, initial encounter for closed fracture: Secondary | ICD-10-CM

## 2018-12-21 DIAGNOSIS — S62111A Displaced fracture of triquetrum [cuneiform] bone, right wrist, initial encounter for closed fracture: Secondary | ICD-10-CM

## 2018-12-21 DIAGNOSIS — S52501A Unspecified fracture of the lower end of right radius, initial encounter for closed fracture: Secondary | ICD-10-CM

## 2018-12-21 DIAGNOSIS — I10 Essential (primary) hypertension: Secondary | ICD-10-CM

## 2018-12-21 DIAGNOSIS — S62342B Nondisplaced fracture of base of third metacarpal bone, right hand, initial encounter for open fracture: Secondary | ICD-10-CM

## 2018-12-21 DIAGNOSIS — S81812A Laceration without foreign body, left lower leg, initial encounter: Secondary | ICD-10-CM

## 2018-12-21 DIAGNOSIS — J449 Chronic obstructive pulmonary disease, unspecified: Secondary | ICD-10-CM

## 2018-12-21 DIAGNOSIS — S334XXA Traumatic rupture of symphysis pubis, initial encounter: Secondary | ICD-10-CM

## 2018-12-21 DIAGNOSIS — S52601A Unspecified fracture of lower end of right ulna, initial encounter for closed fracture: Secondary | ICD-10-CM

## 2018-12-21 DIAGNOSIS — S52502A Unspecified fracture of the lower end of left radius, initial encounter for closed fracture: Secondary | ICD-10-CM

## 2018-12-21 DIAGNOSIS — S62344B Nondisplaced fracture of base of fourth metacarpal bone, right hand, initial encounter for open fracture: Secondary | ICD-10-CM

## 2018-12-21 DIAGNOSIS — F17200 Nicotine dependence, unspecified, uncomplicated: Secondary | ICD-10-CM

## 2018-12-21 HISTORY — DX: Nondisplaced fracture of base of third metacarpal bone, right hand, initial encounter for open fracture: S62.342B

## 2018-12-21 HISTORY — DX: Displaced fracture of middle third of navicular (scaphoid) bone of right wrist, initial encounter for closed fracture: S62.021A

## 2018-12-21 HISTORY — DX: Unspecified fracture of the lower end of right radius, initial encounter for closed fracture: S52.501A

## 2018-12-21 HISTORY — DX: Displaced fracture of triquetrum (cuneiform) bone, right wrist, initial encounter for closed fracture: S62.111A

## 2018-12-21 HISTORY — DX: Displaced fracture of capitate (os magnum) bone, right wrist, initial encounter for closed fracture: S62.131A

## 2018-12-21 HISTORY — DX: Nondisplaced fracture of base of fourth metacarpal bone, right hand, initial encounter for open fracture: S62.344B

## 2018-12-21 HISTORY — DX: Unspecified fracture of the lower end of left radius, initial encounter for closed fracture: S52.502A

## 2018-12-21 HISTORY — DX: Laceration without foreign body, left lower leg, initial encounter: S81.812A

## 2018-12-21 LAB — CBC
HCT: 23.9 % — ABNORMAL LOW (ref 39.0–52.0)
Hemoglobin: 8 g/dL — ABNORMAL LOW (ref 13.0–17.0)
MCH: 31.5 pg (ref 26.0–34.0)
MCHC: 33.5 g/dL (ref 30.0–36.0)
MCV: 94.1 fL (ref 80.0–100.0)
Platelets: 136 10*3/uL — ABNORMAL LOW (ref 150–400)
RBC: 2.54 MIL/uL — ABNORMAL LOW (ref 4.22–5.81)
RDW: 12.5 % (ref 11.5–15.5)
WBC: 8.4 10*3/uL (ref 4.0–10.5)
nRBC: 0 % (ref 0.0–0.2)

## 2018-12-21 MED ORDER — GABAPENTIN 400 MG PO CAPS
400.0000 mg | ORAL_CAPSULE | Freq: Three times a day (TID) | ORAL | Status: DC
  Administered 2018-12-21 – 2018-12-23 (×7): 400 mg via ORAL
  Filled 2018-12-21 (×6): qty 1

## 2018-12-21 MED ORDER — MAGNESIUM CITRATE PO SOLN
1.0000 | Freq: Once | ORAL | Status: AC
  Administered 2018-12-21: 10:00:00 1 via ORAL
  Filled 2018-12-21: qty 296

## 2018-12-21 MED ORDER — TRAMADOL HCL 50 MG PO TABS
50.0000 mg | ORAL_TABLET | Freq: Four times a day (QID) | ORAL | Status: DC
  Administered 2018-12-21 – 2018-12-23 (×8): 50 mg via ORAL
  Filled 2018-12-21 (×8): qty 1

## 2018-12-21 MED ORDER — BISACODYL 10 MG RE SUPP
10.0000 mg | Freq: Once | RECTAL | Status: AC
  Administered 2018-12-21: 10:00:00 10 mg via RECTAL
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
## 2018-12-21 NOTE — Progress Notes (Signed)
Inpatient Rehabilitation-Admissions Coordinator   Pt has decided to pursue CIR. AC will continue to follow for pain control and bed availability.  Please call if questions.   Jhonnie Garner, OTR/L  Rehab Admissions Coordinator  [phone] 12/21/2018 12:18 PM

## 2018-12-21 NOTE — Progress Notes (Signed)
Orthopedic Tech Progress Note Patient Details:  Richard Ferguson [birth year] [id number]  Ortho Devices Type of Ortho Device: Knee Immobilizer Ortho Device/Splint Location: drop off Ortho Device/Splint Interventions: Ordered, Application, Adjustment   Post Interventions Patient Tolerated: Well Instructions Provided: Care of device, Adjustment of device   [name] 12/21/2018, 3:32 PM
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
## 2018-12-22 ENCOUNTER — Encounter (HOSPITAL_COMMUNITY): Payer: Self-pay | Admitting: Physical Medicine and Rehabilitation

## 2018-12-22 MED ORDER — POLYETHYLENE GLYCOL 3350 17 G PO PACK: 17.0000 g | PACK | Freq: Every day | ORAL | Status: DC | PRN
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
## 2018-12-22 NOTE — Progress Notes (Signed)
Orthopedic Tech Progress Note Patient Details:  Richard Ferguson Oct 07, 1975 [id number]  Patient ID: Richard Ferguson, male   DOB: January 21, 1976, 43 y.o.   MRN: [id number]   [name] 12/22/2018, 8:02 AMOrtho Tech needs splinting order specifications, before applying wrist splint. Ortho has several different devices.

## 2018-12-22 NOTE — Progress Notes (Signed)
Inpatient Rehabilitation-Admissions Coordinator   Met with pt at the bedside. He is still in favor of CIR; all paperwork reviewed and consent form signed. I have approval from Attending service for admission Saturday 8/1 to CIR.   PM&R Dr. Delice Lesch will see patient Saturday morning to confirm patient readiness. Pt's RN can call [facility] [phone]) by noon to get report.   Richard Ferguson, OTR/L  Rehab Admissions Coordinator  [phone] 12/22/2018 11:47 AM

## 2018-12-22 NOTE — Progress Notes (Signed)
Orthopedic Tech Progress Note Patient Details:  GLYN GERADS 1975-12-07 [id number]  Ortho Devices Type of Ortho Device: Velcro wrist forearm splint Ortho Device/Splint Location: left Ortho Device/Splint Interventions: Application   Post Interventions Patient Tolerated: Well Instructions Provided: Care of device   [name] 12/22/2018, 2:12 PM

## 2018-12-22 NOTE — Progress Notes (Signed)
Patient ID: Richard Ferguson, male   DOB: 1975/08/02, 43 y.o.   MRN: [id number]    4 Days Post-Op  Subjective: Patient having multiple BMs today from mag citrate and suppository yesterday.  Hasn't taken any more IV pain meds since yesterday morning.  Pain seems well controlled right now.  Objective: Vital signs in last 24 hours: Temp:  [97.8 F (36.6 C)-98.5 F (36.9 C)] 98 F (36.7 C) (07/31 0935) Pulse Rate:  [78-95] 83 (07/31 0935) Resp:  [13-22] 17 (07/31 0935) BP: (151-173)/(68-97) 173/76 (07/31 0935) SpO2:  [96 %-98 %] 98 % (07/31 0935) Last BM Date: 12/17/18  Intake/Output from previous day: 07/30 0701 - 07/31 0700 In: 480 [P.O.:480] Out: 3250 [Urine:3250] Intake/Output this shift: Total I/O In: -  Out: 700 [Urine:700]  PE: Gen: standing getting cleaned up in mild distress secondary to pain Heart: regular Lungs: CTAB Abd: soft, NT, ND, +BS Ext: BUE in splints.  Left thigh ecchymosis as well as scrotal edema and ecchymosis.  Moving BLE well.  Lab Results:  Recent Labs    12/20/18 0607 12/21/18 0407  WBC 11.1* 8.4  HGB 8.1* 8.0*  HCT 24.6* 23.9*  PLT 113* 136*   BMET No results for input(s): NA, K, CL, CO2, GLUCOSE, BUN, CREATININE, CALCIUM in the last 72 hours. PT/INR No results for input(s): LABPROT, INR in the last 72 hours. CMP     Component Value Date/Time   NA 134 (L) 12/19/2018 0504   K 4.1 12/19/2018 0504   CL 105 12/19/2018 0504   CO2 22 12/19/2018 0504   GLUCOSE 118 (H) 12/19/2018 0504   BUN 10 12/19/2018 0504   CREATININE 0.85 12/19/2018 0504   CALCIUM 8.0 (L) 12/19/2018 0504   PROT 6.7 12/17/2018 1914   ALBUMIN 3.7 12/17/2018 1914   AST 37 12/17/2018 1914   ALT 25 12/17/2018 1914   ALKPHOS 67 12/17/2018 1914   BILITOT 0.9 12/17/2018 1914   GFRNONAA >60 12/19/2018 0504   GFRAA >60 12/19/2018 0504   Lipase  No results found for: LIPASE     Studies/Results: No results found.  Anti-infectives: Anti-infectives (From admission,  onward)   Start     Dose/Rate Route Frequency Ordered Stop   12/18/18 2200  ceFAZolin (ANCEF) IVPB 2g/100 mL premix     2 g 200 mL/hr over 30 Minutes Intravenous Every 8 hours 12/18/18 1859 12/19/18 1900   12/18/18 1210  tobramycin (NEBCIN) powder  Status:  Discontinued       As needed 12/18/18 1211 12/18/18 1710   12/18/18 1209  vancomycin (VANCOCIN) powder  Status:  Discontinued       As needed 12/18/18 1209 12/18/18 1710   12/17/18 1930  ceFAZolin (ANCEF) IVPB 1 g/50 mL premix     1 g 100 mL/hr over 30 Minutes Intravenous  Once 12/17/18 1915 12/17/18 2128       Assessment/Plan [facility] pelvic ring injury- OR7/27 by Dr. Doreatha Martin, TDWB on LLE, NWB on RLE, PT/OT Displaced and comminuted intra-articular right distal radius fracture and distal ulna fracture- OR7/27, Dr. Doreatha Martin, weight baring through the elbows on both sides, therapies Displaced extra-articular left distal radius fracture-OR 7/27 by Dr. Doreatha Martin. Weight baring through elbows, PT/OT Multiple nondisplaced carpus including fourth and third metacarpal base fractures, capitate, triquetrum, and a scaphoid waist fracture- Per Dr. Doreatha Martin ABL anemia- stable at 8 COPD- duonebs HTN- norvasc Aortic atherosclerosis Hyponatremia- improved EtOh intoxication-etoh level 223 on presentation, SBIRT AKI-resolved FEN -regular diet VTE -Lovenox Foley - DC 7/29 ID -  Anceffor 3 doses per ortho Dispo - Pt/OT,CIR approved.  No longer using IV pain meds.  Stable for DC to CIR when bed available   LOS: 5 days    Letha CapeKelly E Schae Cando , [facility] 12/22/2018, 10:03 AM Pager: [phone]

## 2018-12-22 NOTE — Progress Notes (Signed)
PT Cancellation Note  Patient Details Name: Richard Ferguson MRN: [id number] DOB: October 31, 1975   Cancelled Treatment:    Reason Eval/Treat Not Completed: Fatigue/lethargy limiting ability to participate.  Pt is worn out from being up and down all night last night having multiple BMs.  He would like to rest, but does want someone to attempt to come by and see him before he transfers to [facility] tomorrow as they do not typically get to the patients on admission day.  PT will continue to follow acutely for safe mobility progression Thanks,  Wells Guiles B. Bibiana Gillean, PT, DPT  Acute Rehabilitation [phone] pager [phone] office  @ [facility]: [phone]     Harvie Heck 12/22/2018, 4:35 PM

## 2018-12-23 ENCOUNTER — Other Ambulatory Visit: Payer: Self-pay

## 2018-12-23 ENCOUNTER — Inpatient Hospital Stay (HOSPITAL_COMMUNITY)
Admission: RE | Admit: 2018-12-23 | Discharge: 2018-12-26 | DRG: 560 | Disposition: A | Discharge status: Home / Self Care | Payer: Self-pay | Source: Intra-hospital | Attending: Physical Medicine & Rehabilitation | Admitting: Physical Medicine & Rehabilitation

## 2018-12-23 ENCOUNTER — Encounter (HOSPITAL_COMMUNITY): Payer: Self-pay

## 2018-12-23 DIAGNOSIS — S32810D Multiple fractures of pelvis with stable disruption of pelvic ring, subsequent encounter for fracture with routine healing: Secondary | ICD-10-CM | POA: Diagnosis not present

## 2018-12-23 DIAGNOSIS — F17210 Nicotine dependence, cigarettes, uncomplicated: Secondary | ICD-10-CM | POA: Diagnosis present

## 2018-12-23 DIAGNOSIS — Z8249 Family history of ischemic heart disease and other diseases of the circulatory system: Secondary | ICD-10-CM | POA: Diagnosis not present

## 2018-12-23 DIAGNOSIS — S52501D Unspecified fracture of the lower end of right radius, subsequent encounter for closed fracture with routine healing: Secondary | ICD-10-CM

## 2018-12-23 DIAGNOSIS — E669 Obesity, unspecified: Secondary | ICD-10-CM | POA: Diagnosis present

## 2018-12-23 DIAGNOSIS — G4733 Obstructive sleep apnea (adult) (pediatric): Secondary | ICD-10-CM | POA: Diagnosis present

## 2018-12-23 DIAGNOSIS — D62 Acute posthemorrhagic anemia: Secondary | ICD-10-CM | POA: Diagnosis present

## 2018-12-23 DIAGNOSIS — D696 Thrombocytopenia, unspecified: Secondary | ICD-10-CM

## 2018-12-23 DIAGNOSIS — E871 Hypo-osmolality and hyponatremia: Secondary | ICD-10-CM | POA: Diagnosis present

## 2018-12-23 DIAGNOSIS — I10 Essential (primary) hypertension: Secondary | ICD-10-CM | POA: Diagnosis present

## 2018-12-23 DIAGNOSIS — S52601D Unspecified fracture of lower end of right ulna, subsequent encounter for closed fracture with routine healing: Secondary | ICD-10-CM | POA: Diagnosis not present

## 2018-12-23 DIAGNOSIS — Z79899 Other long term (current) drug therapy: Secondary | ICD-10-CM | POA: Diagnosis not present

## 2018-12-23 DIAGNOSIS — T07XXXA Unspecified multiple injuries, initial encounter: Secondary | ICD-10-CM

## 2018-12-23 DIAGNOSIS — T1490XA Injury, unspecified, initial encounter: Secondary | ICD-10-CM | POA: Diagnosis present

## 2018-12-23 DIAGNOSIS — Z6838 Body mass index (BMI) 38.0-38.9, adult: Secondary | ICD-10-CM

## 2018-12-23 DIAGNOSIS — G8918 Other acute postprocedural pain: Secondary | ICD-10-CM

## 2018-12-23 DIAGNOSIS — Z886 Allergy status to analgesic agent status: Secondary | ICD-10-CM

## 2018-12-23 DIAGNOSIS — E6609 Other obesity due to excess calories: Secondary | ICD-10-CM

## 2018-12-23 DIAGNOSIS — Z713 Dietary counseling and surveillance: Secondary | ICD-10-CM | POA: Diagnosis not present

## 2018-12-23 DIAGNOSIS — J449 Chronic obstructive pulmonary disease, unspecified: Secondary | ICD-10-CM | POA: Diagnosis present

## 2018-12-23 DIAGNOSIS — S52502D Unspecified fracture of the lower end of left radius, subsequent encounter for closed fracture with routine healing: Secondary | ICD-10-CM

## 2018-12-23 DIAGNOSIS — S3289XD Fracture of other parts of pelvis, subsequent encounter for fracture with routine healing: Secondary | ICD-10-CM | POA: Diagnosis present

## 2018-12-23 HISTORY — DX: Unspecified multiple injuries, initial encounter: T07.XXXA

## 2018-12-23 MED ORDER — METHOCARBAMOL 750 MG PO TABS
750.0000 mg | ORAL_TABLET | Freq: Three times a day (TID) | ORAL | Status: DC
  Administered 2018-12-23 – 2018-12-26 (×8): 750 mg via ORAL
  Filled 2018-12-23 (×8): qty 1

## 2018-12-23 MED ORDER — IPRATROPIUM-ALBUTEROL 0.5-2.5 (3) MG/3ML IN SOLN: 3.0000 mL | RESPIRATORY_TRACT | Status: DC | PRN

## 2018-12-23 MED ORDER — FLEET ENEMA 7-19 GM/118ML RE ENEM: 1.0000 | ENEMA | Freq: Once | RECTAL | Status: DC | PRN

## 2018-12-23 MED ORDER — ONDANSETRON HCL 4 MG/2ML IJ SOLN: 4.0000 mg | Freq: Four times a day (QID) | INTRAMUSCULAR | Status: DC | PRN

## 2018-12-23 MED ORDER — PROCHLORPERAZINE EDISYLATE 10 MG/2ML IJ SOLN: 5.0000 mg | Freq: Four times a day (QID) | INTRAMUSCULAR | Status: DC | PRN

## 2018-12-23 MED ORDER — METHOCARBAMOL 500 MG PO TABS
500.0000 mg | ORAL_TABLET | Freq: Four times a day (QID) | ORAL | Status: DC | PRN
  Administered 2018-12-24 – 2018-12-26 (×2): 500 mg via ORAL
  Filled 2018-12-23 (×2): qty 1

## 2018-12-23 MED ORDER — NICOTINE 7 MG/24HR TD PT24
7.0000 mg | MEDICATED_PATCH | Freq: Every day | TRANSDERMAL | Status: DC
  Administered 2018-12-24 – 2018-12-26 (×3): 7 mg via TRANSDERMAL
  Filled 2018-12-23 (×3): qty 1

## 2018-12-23 MED ORDER — BISACODYL 10 MG RE SUPP
10.0000 mg | Freq: Every day | RECTAL | Status: DC | PRN
  Administered 2018-12-25: 10 mg via RECTAL
  Filled 2018-12-23: qty 1

## 2018-12-23 MED ORDER — GABAPENTIN 400 MG PO CAPS
400.0000 mg | ORAL_CAPSULE | Freq: Three times a day (TID) | ORAL | Status: DC
  Administered 2018-12-23 – 2018-12-26 (×9): 400 mg via ORAL
  Filled 2018-12-23 (×8): qty 1

## 2018-12-23 MED ORDER — MUPIROCIN 2 % EX OINT
1.0000 "application " | TOPICAL_OINTMENT | Freq: Two times a day (BID) | CUTANEOUS | Status: AC
  Administered 2018-12-23: 1 via NASAL
  Filled 2018-12-23: qty 22

## 2018-12-23 MED ORDER — ALUM & MAG HYDROXIDE-SIMETH 200-200-20 MG/5ML PO SUSP: 30.0000 mL | ORAL | Status: DC | PRN

## 2018-12-23 MED ORDER — ONDANSETRON 4 MG PO TBDP
4.0000 mg | ORAL_TABLET | Freq: Four times a day (QID) | ORAL | Status: DC | PRN
  Filled 2018-12-23: qty 1

## 2018-12-23 MED ORDER — PROCHLORPERAZINE 25 MG RE SUPP
12.5000 mg | Freq: Four times a day (QID) | RECTAL | Status: DC | PRN
  Filled 2018-12-23: qty 1

## 2018-12-23 MED ORDER — AMLODIPINE BESYLATE 10 MG PO TABS
10.0000 mg | ORAL_TABLET | Freq: Every day | ORAL | Status: DC
  Administered 2018-12-24 – 2018-12-26 (×3): 10 mg via ORAL
  Filled 2018-12-23 (×3): qty 1

## 2018-12-23 MED ORDER — PROCHLORPERAZINE MALEATE 5 MG PO TABS: 5.0000 mg | ORAL_TABLET | Freq: Four times a day (QID) | ORAL | Status: DC | PRN

## 2018-12-23 MED ORDER — POLYETHYLENE GLYCOL 3350 17 G PO PACK
17.0000 g | PACK | Freq: Every day | ORAL | Status: DC
  Administered 2018-12-23 – 2018-12-26 (×4): 17 g via ORAL
  Filled 2018-12-23 (×4): qty 1

## 2018-12-23 MED ORDER — GUAIFENESIN-DM 100-10 MG/5ML PO SYRP: 5.0000 mL | ORAL_SOLUTION | Freq: Four times a day (QID) | ORAL | Status: DC | PRN

## 2018-12-23 MED ORDER — OXYCODONE HCL 5 MG PO TABS
5.0000 mg | ORAL_TABLET | ORAL | Status: DC | PRN
  Administered 2018-12-24 – 2018-12-25 (×3): 10 mg via ORAL
  Filled 2018-12-23 (×5): qty 2

## 2018-12-23 MED ORDER — TRAZODONE HCL 50 MG PO TABS: 25.0000 mg | ORAL_TABLET | Freq: Every evening | ORAL | Status: DC | PRN

## 2018-12-23 MED ORDER — OXYCODONE HCL 5 MG PO TABS
10.0000 mg | ORAL_TABLET | ORAL | Status: DC | PRN
  Administered 2018-12-23 – 2018-12-24 (×3): 10 mg via ORAL
  Administered 2018-12-24: 15 mg via ORAL
  Administered 2018-12-25 (×2): 10 mg via ORAL
  Filled 2018-12-23 (×3): qty 2
  Filled 2018-12-23: qty 3

## 2018-12-23 MED ORDER — ACETAMINOPHEN 325 MG PO TABS: 325.0000 mg | ORAL_TABLET | ORAL | Status: DC | PRN

## 2018-12-23 MED ORDER — DIPHENHYDRAMINE HCL 12.5 MG/5ML PO ELIX: 12.5000 mg | ORAL_SOLUTION | Freq: Four times a day (QID) | ORAL | Status: DC | PRN

## 2018-12-23 MED ORDER — ENOXAPARIN SODIUM 40 MG/0.4ML ~~LOC~~ SOLN
40.0000 mg | SUBCUTANEOUS | Status: DC
  Administered 2018-12-24: 40 mg via SUBCUTANEOUS
  Filled 2018-12-23: qty 0.4

## 2018-12-23 MED ORDER — ACETAMINOPHEN 500 MG PO TABS
1000.0000 mg | ORAL_TABLET | Freq: Four times a day (QID) | ORAL | Status: DC
  Administered 2018-12-23 – 2018-12-25 (×7): 1000 mg via ORAL
  Filled 2018-12-23 (×8): qty 2

## 2018-12-23 NOTE — Progress Notes (Signed)
Pt to CIR today All questions answered.

## 2018-12-23 NOTE — Progress Notes (Signed)
Patient admitted to IP rehab during shift. Complains of generalized pain when moving in bed rated 5/10. Patient informed of unit safety plan an understands. Patient now resting in bed with call light iwithin reach no further questions at time. Adria Devon, LPN

## 2018-12-23 NOTE — Progress Notes (Signed)
Patient was admitted with the following skin problems:  1- Abrasion to right posterior knee- removed foam dsg.  Area is scabbed.  No need for dressing. 2- Abrasion to left posterior shoulder- no dressing.  Scabbed, no need for dressing. 3- Left posterior calf has a laceration with sutures.  It is dusky/red.  Removed foam dressing, it was saturated with drainage.  Covered incision with xeroform gauze and kerlix. 4- Avulsion next to left posterior calf incision- Approximately 2x2 in size, moist.  Applied xeroform gauze and kerlix. 5- Left posterior elbow avulsion- Foam removed, wound bed covered with thin yellow film.  Cleansed with NS and film came off.  Applied xeroform gauze and kerlix. 6- Avulsion to right upper arm- near elbow- just above splint- Foam removed- was soaked in drainage, wound bed covered with thin yellow film.  Cleansed with NS and film came off revealing pink granulation tissue.  Applied xeroform gauze and abdominal pad and secured with paper tape. 7- Deep purple bruising noted to bilateral thighs and hips. 8- Scrotum is edematous and painful- no interventions.  Brita Romp, RN

## 2018-12-23 NOTE — Plan of Care (Signed)
  Problem: Education: Goal: Knowledge of General Education information will improve Description: Including pain rating scale, medication(s)/side effects and non-pharmacologic comfort measures Outcome: Completed/Met   Problem: Health Behavior/Discharge Planning: Goal: Ability to manage health-related needs will improve Outcome: Completed/Met   Problem: Clinical Measurements: Goal: Ability to maintain clinical measurements within normal limits will improve Outcome: Completed/Met Goal: Respiratory complications will improve Outcome: Completed/Met   Problem: Activity: Goal: Risk for activity intolerance will decrease Outcome: Completed/Met   Problem: Elimination: Goal: Will not experience complications related to bowel motility Outcome: Completed/Met Goal: Will not experience complications related to urinary retention Outcome: Completed/Met   Problem: Pain Managment: Goal: General experience of comfort will improve Outcome: Completed/Met   Problem: Safety: Goal: Ability to remain free from injury will improve Outcome: Completed/Met   Problem: Skin Integrity: Goal: Risk for impaired skin integrity will decrease Outcome: Completed/Met   Problem: Acute Rehab OT Goals (only OT should resolve) Goal: Pt. Will Perform Grooming Outcome: Completed/Met Goal: Pt. Will Perform Upper Body Dressing Outcome: Completed/Met Goal: Pt. Will Perform Lower Body Dressing Outcome: Completed/Met Goal: Pt. Will Transfer To Toilet Outcome: Completed/Met Goal: Pt/Caregiver Will Perform Home Exercise Program Outcome: Completed/Met Goal: OT Additional ADL Goal #1 Outcome: Completed/Met   Problem: Acute Rehab PT Goals(only PT should resolve) Goal: Pt Will Go Supine/Side To Sit Outcome: Completed/Met Goal: Pt Will Go Sit To Supine/Side Outcome: Completed/Met Goal: Patient Will Transfer Sit To/From Stand Outcome: Completed/Met Goal: Pt Will Transfer Bed To Chair/Chair To Bed Outcome:  Completed/Met
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
## 2018-12-23 NOTE — Social Work (Addendum)
12:05pm- Spoke with pt telephonically, confirmed address. Pt does not live with his wife but family is supportive including his daughters Caitlyn and [place]. Pt excited about going to CIR today and looking forward to coming back home when able. We discussed pt substance use. Pt admits to consuming ETOH "earlier in the day." He only discusses drinking ETOH on weekends and rarely drinking more than 6 drinks on one occasion. Pt declines any offered resources. All questions answered. CSW signing off. Please consult if any additional needs arise.   11:45am- Attempted to reach pt telephonically for SBIRT x4. Requested RN move phone near pt if possible. Will continue to re-attempt prior to transfer to CIR.   Alexander Mt, Shepherd Work [phone]

## 2018-12-23 NOTE — Progress Notes (Addendum)
Patient discharged to [facility] after report given to [place], Therapist, sports.  Patient expresses hopefulness re:  Re-hab in-house.   Discharged to CIR via wheelchair accompanied by nurse and nurse tech from re-hab.

## 2018-12-24 ENCOUNTER — Inpatient Hospital Stay (HOSPITAL_COMMUNITY): Payer: Self-pay

## 2018-12-24 DIAGNOSIS — T1490XA Injury, unspecified, initial encounter: Secondary | ICD-10-CM

## 2018-12-24 DIAGNOSIS — E6609 Other obesity due to excess calories: Secondary | ICD-10-CM

## 2018-12-24 DIAGNOSIS — Z6838 Body mass index (BMI) 38.0-38.9, adult: Secondary | ICD-10-CM

## 2018-12-24 LAB — CBC WITH DIFFERENTIAL/PLATELET
Abs Immature Granulocytes: 0.05 10*3/uL (ref 0.00–0.07)
Basophils Absolute: 0.1 10*3/uL (ref 0.0–0.1)
Basophils Relative: 1 %
Eosinophils Absolute: 0.3 10*3/uL (ref 0.0–0.5)
Eosinophils Relative: 3 %
HCT: 26.9 % — ABNORMAL LOW (ref 39.0–52.0)
Hemoglobin: 9.1 g/dL — ABNORMAL LOW (ref 13.0–17.0)
Immature Granulocytes: 1 %
Lymphocytes Relative: 16 %
Lymphs Abs: 1.3 10*3/uL (ref 0.7–4.0)
MCH: 30.5 pg (ref 26.0–34.0)
MCHC: 33.8 g/dL (ref 30.0–36.0)
MCV: 90.3 fL (ref 80.0–100.0)
Monocytes Absolute: 0.9 10*3/uL (ref 0.1–1.0)
Monocytes Relative: 11 %
Neutro Abs: 5.6 10*3/uL (ref 1.7–7.7)
Neutrophils Relative %: 68 %
Platelets: 260 10*3/uL (ref 150–400)
RBC: 2.98 MIL/uL — ABNORMAL LOW (ref 4.22–5.81)
RDW: 12.2 % (ref 11.5–15.5)
WBC: 8.1 10*3/uL (ref 4.0–10.5)
nRBC: 0 % (ref 0.0–0.2)

## 2018-12-24 LAB — COMPREHENSIVE METABOLIC PANEL
ALT: 41 U/L (ref 0–44)
AST: 53 U/L — ABNORMAL HIGH (ref 15–41)
Albumin: 2.7 g/dL — ABNORMAL LOW (ref 3.5–5.0)
Alkaline Phosphatase: 99 U/L (ref 38–126)
Anion gap: 10 (ref 5–15)
BUN: 11 mg/dL (ref 6–20)
CO2: 24 mmol/L (ref 22–32)
Calcium: 8.5 mg/dL — ABNORMAL LOW (ref 8.9–10.3)
Chloride: 100 mmol/L (ref 98–111)
Creatinine, Ser: 0.74 mg/dL (ref 0.61–1.24)
GFR calc Af Amer: >60 mL/min (ref >60)
GFR calc non Af Amer: >60 mL/min (ref >60)
Glucose, Bld: 113 mg/dL — ABNORMAL HIGH (ref 70–99)
Potassium: 4.2 mmol/L (ref 3.5–5.1)
Sodium: 134 mmol/L — ABNORMAL LOW (ref 135–145)
Total Bilirubin: 1 mg/dL (ref 0.3–1.2)
Total Protein: 5.9 g/dL — ABNORMAL LOW (ref 6.5–8.1)

## 2018-12-24 MED ORDER — ENOXAPARIN SODIUM 60 MG/0.6ML ~~LOC~~ SOLN
60.0000 mg | SUBCUTANEOUS | Status: DC
  Administered 2018-12-25 – 2018-12-26 (×2): 60 mg via SUBCUTANEOUS
  Filled 2018-12-24 (×2): qty 0.6
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
## 2018-12-24 NOTE — Plan of Care (Signed)
  Problem: Consults Goal: RH GENERAL PATIENT EDUCATION Description: See Patient Education module for education specifics. Outcome: Progressing Goal: Skin Care Protocol Initiated - if Braden Score 18 or less Description: If consults are not indicated, leave blank or document N/A Outcome: Progressing   Problem: RH SKIN INTEGRITY Goal: RH STG SKIN FREE OF INFECTION/BREAKDOWN Description: Patients skin will remain free from further infection or breakdown with mod assist. Outcome: Progressing Goal: RH STG MAINTAIN SKIN INTEGRITY WITH ASSISTANCE Description: STG Maintain Skin Integrity With mod Assistance. Outcome: Progressing Goal: RH STG ABLE TO PERFORM INCISION/WOUND CARE W/ASSISTANCE Description: STG Able To Perform Incision/Wound Care With total Assistance from caregiver. Outcome: Progressing   Problem: RH PAIN MANAGEMENT Goal: RH STG PAIN MANAGED AT OR BELOW PT'S PAIN GOAL Description: < 4 Outcome: Progressing
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
## 2018-12-24 NOTE — Progress Notes (Signed)
Occupational Therapy Session Note  Patient Details  Name: Richard Ferguson MRN: [id number] Date of Birth: 1975-09-18  Today's Date: 12/24/2018 OT Individual Time: 1400-1500 OT Individual Time Calculation (min): 60 min    Short Term Goals: Week 1:  OT Short Term Goal 1 (Week 1): STG= LTG d/t ELOS  Skilled Therapeutic Interventions/Progress Updates:  Pt received supine agreeable to therapy. Pt completed bed mobility with CGA to EOB. Pt completed stand pivot transfer with use of B platform walker to w/c, with min cueing for adherence to RLE NWB status and safety. Pt completing with CGA overall. Pt was brought to sink and assisted in washing hair. Pt then washed UB with min A. Pt reported that lightly grasping washcloth hurt initially but by end of session observed more AROM in fingers. Discussed importance of maximizing his participation in ADLs at home despite family assistance. Pt completed LB bathing seated. Pt required min A to don pants. Pt was brought down to tub room and given demonstration re use of TTB. Pt stating he would like to purchase one. Clarified with pt that he would need to wait for surgeon approval to shower, pt understanding. Discussed psychosocial support available here and pt became emotional talking about missing family. Emotional support provided and pt very thankful. Pt was returned to his room and left supine with all needs met.   Therapy Documentation Precautions:  Precautions Precautions: Fall, Other (comment) Required Braces or Orthoses: Splint/Cast, Other Brace Splint/Cast: bilat wrist splint Other Brace: scrotal sling Restrictions Weight Bearing Restrictions: Yes RUE Weight Bearing: Weight bear through elbow only LUE Weight Bearing: Weight bear through elbow only RLE Weight Bearing: Non weight bearing LLE Weight Bearing: Touchdown weight bearing LLE Partial Weight Bearing Percentage or Pounds: WB for stand pivot transfers only  Therapy/Group: Individual  Therapy  Curtis Sites 12/24/2018, 4:02 PM

## 2018-12-25 ENCOUNTER — Inpatient Hospital Stay (HOSPITAL_COMMUNITY): Payer: Self-pay | Admitting: Occupational Therapy

## 2018-12-25 ENCOUNTER — Inpatient Hospital Stay (HOSPITAL_COMMUNITY): Payer: Self-pay | Admitting: Rehabilitation

## 2018-12-25 ENCOUNTER — Inpatient Hospital Stay (HOSPITAL_COMMUNITY): Payer: Self-pay | Admitting: Physical Therapy

## 2018-12-25 LAB — CBC WITH DIFFERENTIAL/PLATELET
Abs Immature Granulocytes: 0.11 10*3/uL — ABNORMAL HIGH (ref 0.00–0.07)
Basophils Absolute: 0.1 10*3/uL (ref 0.0–0.1)
Basophils Relative: 1 %
Eosinophils Absolute: 0.4 10*3/uL (ref 0.0–0.5)
Eosinophils Relative: 3 %
HCT: 29.6 % — ABNORMAL LOW (ref 39.0–52.0)
Hemoglobin: 9.7 g/dL — ABNORMAL LOW (ref 13.0–17.0)
Immature Granulocytes: 1 %
Lymphocytes Relative: 16 %
Lymphs Abs: 1.7 10*3/uL (ref 0.7–4.0)
MCH: 30.1 pg (ref 26.0–34.0)
MCHC: 32.8 g/dL (ref 30.0–36.0)
MCV: 91.9 fL (ref 80.0–100.0)
Monocytes Absolute: 0.8 10*3/uL (ref 0.1–1.0)
Monocytes Relative: 8 %
Neutro Abs: 7.5 10*3/uL (ref 1.7–7.7)
Neutrophils Relative %: 71 %
Platelets: 369 10*3/uL (ref 150–400)
RBC: 3.22 MIL/uL — ABNORMAL LOW (ref 4.22–5.81)
RDW: 12.4 % (ref 11.5–15.5)
WBC: 10.5 10*3/uL (ref 4.0–10.5)
nRBC: 0.2 % (ref 0.0–0.2)

## 2018-12-25 MED ORDER — OXYCODONE HCL 5 MG PO TABS
5.0000 mg | ORAL_TABLET | ORAL | Status: DC | PRN
  Administered 2018-12-25 – 2018-12-26 (×4): 10 mg via ORAL
  Filled 2018-12-25 (×4): qty 2

## 2018-12-25 MED ORDER — ENOXAPARIN (LOVENOX) PATIENT EDUCATION KIT
PACK | Freq: Once | Status: DC
  Filled 2018-12-25: qty 1

## 2018-12-25 MED ORDER — ACETAMINOPHEN 500 MG PO TABS
500.0000 mg | ORAL_TABLET | Freq: Three times a day (TID) | ORAL | Status: DC
  Administered 2018-12-25 – 2018-12-26 (×3): 1000 mg via ORAL
  Filled 2018-12-25 (×3): qty 2
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
## 2018-12-25 NOTE — Progress Notes (Signed)
Occupational Therapy Session Note  Patient Details  Name: Richard Ferguson MRN: [id number] Date of Birth: 1976/01/21  Today's Date: 12/25/2018 OT Individual Time: 1300-1355 OT Individual Time Calculation (min): 55 min    Short Term Goals: Week 1:  OT Short Term Goal 1 (Week 1): STG= LTG d/t ELOS  Skilled Therapeutic Interventions/Progress Updates:    Pt seen for OT session focusing on functional transfers and mobility. Pt sitting up in w/c upon arrival, agreeable to tx session. Voiced pain 4/10 in L pelvis, RN administered pain medication during session. Pt's mother present for session and provided hands on caregiver training during session. Pt taken to ADL apartment and completed simulated tub/shower transfer utilizing tub bench. Following demonstration for technique, pt return demonstrated with mod A for managing LEs over tub wall. Steadying assist for stand pivot from w/c to TTB. Discussed modified ADLs, DME, and AE.  Education and demonstration provided to mother and pt regarding w/c parts management and pt directing care of needs.  Pt returned to room. L LE leg wound noted to be saturated through dressing. RN made aware and therapist assisted pt in standing while RN completed dressing change. Pt left seated in w/c at end of session, all needs in reach awaiting hand off to PT.   Therapy Documentation Precautions:  Precautions Precautions: Fall, Other (comment) Required Braces or Orthoses: Splint/Cast, Other Brace Splint/Cast: bilat wrist splint Other Brace: scrotal sling Restrictions Weight Bearing Restrictions: Yes RUE Weight Bearing: Weight bear through elbow only LUE Weight Bearing: Weight bear through elbow only RLE Weight Bearing: Non weight bearing LLE Weight Bearing: Touchdown weight bearing LLE Partial Weight Bearing Percentage or Pounds: WB for stand pivot transfers only   Therapy/Group: Individual Therapy  [name] L 12/25/2018, 1:57 PM

## 2018-12-25 NOTE — Progress Notes (Signed)
  Patient ID: Richard Ferguson, male   DOB: 1975/06/06, 43 y.o.   MRN: [id number]    Patient will require a wide RW at d/c due to body habitus and in order to safely adhere to weight-bearing precautions. Due to addition of bilateral upper extremity platforms to allow weight-bearing through elbows pt does not fit into a regular RW. In order to functionally and safely perform transfers patient requires a wide RW.

## 2018-12-25 NOTE — Progress Notes (Signed)
Physical Therapy Session Note  Patient Details  Name: Richard Ferguson MRN: [id number] Date of Birth: November 13, 1975  Today's Date: 12/25/2018 PT Individual Time: 1049-1200 PT Individual Time Calculation (min): 71 min   Short Term Goals: Week 1:  PT Short Term Goal 1 (Week 1): = LTGs due to short LOS  Skilled Therapeutic Interventions/Progress Updates:   Pt received lying in bed, mother present in room.  Pt agreeable to therapy and verbalizing he would like to leave tomorrow.  Discussed that PT would like to see him transfer and provide him with exercises during this session and also provide mother with some education on cuing and how to assist at home.  Performed bed mobility at min A level today with PT hooking arms with pt to elevate trunk to EOB to maintain WB status.  Pt requests walker to assist in scooting forward due to pain.  Once at EOB, pt able to stand at S level and transfer to w/c via stand pivot at S level with continued cues to maintain WB precautions.  Note at one point when donning shirt, he let go of walker to pull shirt over head, despite cues for precautions.  Pt verbalized understanding.  Provided hands on education regarding w/c parts management and let mother perform during session.  Assisted to day room and transferred to mat as above.  Performed the following exercises and provided for HEP at home.  Discussed D/C tomorrow after therapies and that PT this afternoon would take pt to actual car (mother's) to ensure he can get in/out safely.  Also discussed ramp as his is being built but he felt that they could lay down 4x6 beams to get him into house until it is complete.  Social worker and PT/OT team notified, MD cleared.  Pt notified and is ready for d/c after PT/OT tomorrow. Pt left in w/c with all needs in reach.    Access Code: [email or web address]  URL: [email or web address]  Date: 12/25/2018  Prepared by: Cameron Sprang   Exercises Clamshell - 10 reps - 1 sets - 2x daily -  7x weekly Seated Long Arc Quad - 10 reps - 1 sets - 2x daily - 7x weekly Supine Heel Slides - 10 reps - 1 sets - 2x daily - 7x weekly Seated Heel Slide - 10 reps - 1 sets - 2x daily - 7x weekly Supine Short Arc Quad - 10 reps - 1 sets - 2x daily - 7x weekly Seated March - 10 reps - 1 sets - 2x daily - 7x weekly Supine Straight Leg Raises - 10 reps - 1 sets - 2x daily - 7x weekly                  Quad Set - 10 reps - 1 sets - 2x daily - 7x weekly  Therapy Documentation Precautions:  Precautions Precautions: Fall, Other (comment) Required Braces or Orthoses: Splint/Cast, Other Brace Splint/Cast: bilat wrist splint Other Brace: scrotal sling Restrictions Weight Bearing Restrictions: Yes RUE Weight Bearing: Weight bear through elbow only LUE Weight Bearing: Weight bear through elbow only RLE Weight Bearing: Non weight bearing LLE Weight Bearing: Touchdown weight bearing LLE Partial Weight Bearing Percentage or Pounds: WB for stand pivot transfers only   Pain: Pain Assessment Pain Scale: 0-10 Pain Score: 5  Pain Type: Acute pain;Surgical pain Pain Location: Wrist Pain Orientation: Right Pain Descriptors / Indicators: Aching;Discomfort Patients Stated Pain Goal: 4 Pain Intervention(s): Medication (See eMAR)    Therapy/Group: Individual Therapy  Livingston Denner, Raquel Sarna  Ann 12/25/2018, 12:12 PM

## 2018-12-25 NOTE — Progress Notes (Signed)
Patient information reviewed and entered into e[facility] System by Becky Shawnya Mayor, PPS coordinator. Information including medical coding, function ability, and quality indicators will be reviewed and updated through discharge.   
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
## 2018-12-25 NOTE — Plan of Care (Signed)
  Problem: Consults Goal: RH GENERAL PATIENT EDUCATION Description: See Patient Education module for education specifics. Outcome: Progressing Goal: Skin Care Protocol Initiated - if Braden Score 18 or less Description: If consults are not indicated, leave blank or document N/A Outcome: Progressing   Problem: RH SKIN INTEGRITY Goal: RH STG SKIN FREE OF INFECTION/BREAKDOWN Description: Patients skin will remain free from further infection or breakdown with mod assist. Outcome: Progressing Goal: RH STG MAINTAIN SKIN INTEGRITY WITH ASSISTANCE Description: STG Maintain Skin Integrity With mod Assistance. Outcome: Progressing Goal: RH STG ABLE TO PERFORM INCISION/WOUND CARE W/ASSISTANCE Description: STG Able To Perform Incision/Wound Care With total Assistance from caregiver. Outcome: Progressing   Problem: RH PAIN MANAGEMENT Goal: RH STG PAIN MANAGED AT OR BELOW PT'S PAIN GOAL Description: < 4 Outcome: Progressing   

## 2018-12-25 NOTE — Progress Notes (Signed)
[facility] Individual Statement of Services  Patient Name:  SHOJI PERTUIT  Date:  12/25/2018  Welcome to the [facility].  Our goal is to provide you with an individualized program based on your diagnosis and situation, designed to meet your specific needs.  With this comprehensive rehabilitation program, you will be expected to participate in at least 3 hours of rehabilitation therapies Monday-Friday, with modified therapy programming on the weekends.  Your rehabilitation program will include the following services:  Physical Therapy (PT), Occupational Therapy (OT), 24 hour per day rehabilitation nursing, Case Management (Social Worker), Rehabilitation Medicine, Nutrition Services and Pharmacy Services  Weekly team conferences will be held on Tuesdays to discuss your progress.  Your Social Worker will talk with you frequently to get your input and to update you on team discussions.  Team conferences with you and your family in attendance may also be held.  Expected length of stay:   3 to 5 days  Overall anticipated outcome:   Supervision/minimal assistance  Depending on your progress and recovery, your program may change. Your Social Worker will coordinate services and will keep you informed of any changes. Your Social Worker's name and contact numbers are listed  below.  The following services may also be recommended but are not provided by the [facility] will be made to provide these services after discharge if needed.  Arrangements include referral to agencies that provide these services.  Your insurance has been verified to be:  None at this time Your primary doctor is:  No one at this time  Pertinent information will be shared with your doctor and your insurance company.  Social  Worker:  Alfonse Alpers, LCSW  [phone] or ([phone]  Information discussed with and copy given to patient by: Trey Sailors, 12/25/2018, 1:56 PM

## 2018-12-25 NOTE — Progress Notes (Signed)
Physical Therapy Session Note  Patient Details  Name: Richard Ferguson MRN: [id number] Date of Birth: 02-04-1976  Today's Date: 12/25/2018 PT Individual Time: 1400-1445 PT Individual Time Calculation (min): 45 min   Short Term Goals: Week 1:  PT Short Term Goal 1 (Week 1): = LTGs due to short LOS  Skilled Therapeutic Interventions/Progress Updates:    Pt received seated in w/c in room, agreeable to PT session. Pt reports pain in RUE and L pelvic region at rest, not rated and declines intervention. Dependent transport via w/c down to main entrance of hospital to practice car transfer on vehicle that pt will d/c home in. Reviewed how to safely setup and position PFRW and w/c for stand pivot transfer w/c to car. Pt is able to perform transfer with CGA with v/c for adherence to [facility] precautions and for safety. Pt's mother present during session and is able to observe transfer and verbalize how to assist pt with transfer. Pt declines to perform the transfer a 2nd time wit and his mother states she feels comfortable assisting the pt with this transfer. Pt requests to lay down at end of session. Pt is CGA for SPT back to bed with PFRW. Sit to supine mod A for BLE management. Pt left semi-reclined in bed with needs in reach, bed alarm in place, mother present at end of session.  Therapy Documentation Precautions:  Precautions Precautions: Fall, Other (comment) Required Braces or Orthoses: Splint/Cast, Other Brace Splint/Cast: bilat wrist splint Other Brace: scrotal sling Restrictions Weight Bearing Restrictions: Yes RUE Weight Bearing: Weight bear through elbow only LUE Weight Bearing: Weight bear through elbow only RLE Weight Bearing: Non weight bearing LLE Weight Bearing: Touchdown weight bearing LLE Partial Weight Bearing Percentage or Pounds: WB for stand pivot transfers only    Therapy/Group: Individual Therapy   Excell Seltzer, PT, DPT  12/25/2018, 4:05 PM

## 2018-12-26 ENCOUNTER — Inpatient Hospital Stay (HOSPITAL_COMMUNITY): Payer: Self-pay | Admitting: Occupational Therapy

## 2018-12-26 ENCOUNTER — Inpatient Hospital Stay (HOSPITAL_COMMUNITY): Payer: Self-pay | Admitting: Physical Therapy

## 2018-12-26 MED ORDER — ACETAMINOPHEN 500 MG PO TABS: 500.0000 mg | ORAL_TABLET | Freq: Three times a day (TID) | ORAL | 0 refills | Status: AC

## 2018-12-26 MED ORDER — METHOCARBAMOL 750 MG PO TABS: 750.0000 mg | ORAL_TABLET | Freq: Three times a day (TID) | ORAL | 0 refills | Status: DC

## 2018-12-26 MED ORDER — ENOXAPARIN SODIUM 60 MG/0.6ML ~~LOC~~ SOLN: 60.0000 mg | SUBCUTANEOUS | 0 refills | Status: DC

## 2018-12-26 MED ORDER — NICOTINE 7 MG/24HR TD PT24: 7.0000 mg | MEDICATED_PATCH | Freq: Every day | TRANSDERMAL | 0 refills | Status: DC

## 2018-12-26 MED ORDER — OXYCODONE HCL 10 MG PO TABS: 5.0000 mg | ORAL_TABLET | Freq: Four times a day (QID) | ORAL | 0 refills | Status: DC | PRN

## 2018-12-26 MED ORDER — GABAPENTIN 400 MG PO CAPS: 400.0000 mg | ORAL_CAPSULE | Freq: Three times a day (TID) | ORAL | 1 refills | Status: AC

## 2018-12-26 MED ORDER — POLYETHYLENE GLYCOL 3350 17 G PO PACK: 17.0000 g | PACK | Freq: Every day | ORAL | 0 refills | Status: DC

## 2018-12-26 MED ORDER — AMLODIPINE BESYLATE 10 MG PO TABS: 10.0000 mg | ORAL_TABLET | Freq: Every day | ORAL | 1 refills | Status: DC

## 2018-12-26 MED FILL — METHOCARBAMOL 750 MG TABS: 750 | 30 days supply | Qty: 90 | Fill #0

## 2018-12-26 MED FILL — oxyCODONE HCL 10 MG TABS: 10 | 7 days supply | Qty: 28 | Fill #0

## 2018-12-26 MED FILL — GABAPENTIN 400 MG CAPSULE: 400 | 30 days supply | Qty: 90 | Fill #0

## 2018-12-26 MED FILL — ENOXAPARIN 60 MG/0.6 ML SYR: 60 | 30 days supply | Qty: 18 | Fill #0

## 2018-12-26 MED FILL — AMLODIPINE BESYLATE 10 MG T: 10 | 30 days supply | Qty: 30 | Fill #0
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
## 2018-12-26 NOTE — Progress Notes (Signed)
Ortho Trauma Progress Note  Doing well. Pain controlled  Dressing to RUE changed. Wounds stable no signs of infection or drainage. LUE wound stable. Pfannenstiel incision stable as well.  Plan: WB thru LLE for stand pivot transfers, NWB RLE WBAT BUE thru elbow, NWB through wrists Removable wrist splint bilateral upper extremities Okay for gentle ROM bilateral wrists, aggressive finger ROM Follow up next week for x-rays and suture removal as outpatient.  Shona Needles, MD [facility] [phone] (phone) [phone] (office) [email or web address]

## 2018-12-26 NOTE — Progress Notes (Signed)
Ortho tech paged to bring patient new wrist splint for discharge. All other equipment was delivered and is at bedside. Pt has all new dressings intact. Awaiting discharge.
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
## 2018-12-26 NOTE — Progress Notes (Signed)
Orthopedic Tech Progress Note Patient Details:  Richard Ferguson [birth year] [id number]  Ortho Devices Type of Ortho Device: Velcro wrist forearm splint Ortho Device/Splint Location: right Ortho Device/Splint Interventions: Application   Post Interventions Patient Tolerated: Well Instructions Provided: Care of device   [name] 12/26/2018, 11:10 AM

## 2018-12-26 NOTE — Discharge Instructions (Signed)
Inpatient Rehab Discharge Instructions  Richard Ferguson Discharge date and time: 12/26/18   Activities/Precautions/ Functional Status: Activity: no lifting, driving, or strenuous exercise  till cleared by  MD.  Diet: cardiac diet Wound Care: Wash with soap and water. Keep dry dressing on right wrist incision.    Functional status:  ___ No restrictions     ___ Walk up steps independently _X__ 24/7 supervision/assistance   ___ Walk up steps with assistance ___ Intermittent supervision/assistance  ___ Bathe/dress independently ___ Walk with walker     _X__ Bathe/dress with assistance ___ Walk Independently    ___ Shower independently ___ Walk with assistance    ___ Shower with assistance _X__ No alcohol     ___ Return to work/school ________   COMMUNITY REFERRALS UPON DISCHARGE:   Outpatient: You and your therapists decided to wait until your weight bearing status changes to start outpatient therapies.  Complete home exercises therapists have given you.  Medical Equipment/Items Ordered:  20"x18" lightweight wheelchair with elevating leg rests and basic cushion; drop arm commode; bilateral platform wide rolling walker; tub transfer bench  Agency/Supplier:  [employer]       Phone:  [phone]   Keep up the good work on smoking cessation!   Special Instructions: 1. Wear wrist splints at all times.  2. Touch down weight on Left leg for transfers only 3. No weight on Right leg 4. Weight bearing as tolerated on bilateral elbows--no weight on wrists.  5. Need to set up with primary care provider. See list at the back--need follow up on blood pressure and acute blood loss anemia.     My questions have been answered and I understand these instructions. I will adhere to these goals and the provided educational materials after my discharge from the hospital.  Patient/Caregiver Signature _______________________________ Date __________  Clinician Signature  _______________________________________ Date __________  Please bring this form and your medication list with you to all your follow-up doctor's appointments.

## 2018-12-26 NOTE — Progress Notes (Signed)
Pt educated on Lovenox administration. Pt was able to inject the Lovenox needle but was unable to manage the plunger. Pt feels confident that his daughter will be able to help him with this at home as she is diabetic and manages her own injections. Pt given instruction kit and Rn explained how to dispose of sharp needles when finished.

## 2018-12-26 NOTE — Progress Notes (Signed)
Occupational Therapy Session Note  Patient Details  Name: Richard Ferguson MRN: [id number] Date of Birth: [birth year]  Today's Date: 12/26/2018 OT Individual Time: 0102-7253 OT Individual Time Calculation (min): 58 min    Short Term Goals: Week 1:  OT Short Term Goal 1 (Week 1): STG= LTG d/t ELOS  Skilled Therapeutic Interventions/Progress Updates:    Pt seen for OT session focusing on ADL re-training and functional mobility. Pt in supine upon arrival, nurse exiting having just administered pain medications. Pt reports surgeon just completed bandage change in B UEs with increased pain following. Pt willing to participate in therapy session as able. He transferred to sitting EOB with HOB elevated as he is able to do at home and min A to bring trunk upright with VCs to adhere to pre-cautions.  Adjusted PFRW to facilitate R elbow WBing only, however, pt unable to tolerate and therefore readjusted back to lowered position. He completed stand pivot transfer to w/c using PFRW. Required VCs to adhere to [facility] pre-caution with minial follow through/adherence to [facility] pre-cautions on all extremities.  He completed grooming tasks from w/c level at sink mod I, able to set-up all needed items independently and maintain functional grasp on comb and toothbrush.  Pt left seated in w/c at end of session, LEs elevated on ELR, ice pack applied to L pelvis and all needs in reach.   Therapy Documentation Precautions:  Precautions Precautions: Fall, Other (comment) Required Braces or Orthoses: Splint/Cast, Other Brace Splint/Cast: bilat wrist splint Other Brace: scrotal sling Restrictions Weight Bearing Restrictions: Yes RUE Weight Bearing: Non weight bearing LUE Weight Bearing: Non weight bearing RLE Weight Bearing: Non weight bearing LLE Weight Bearing: Weight bearing as tolerated LLE Partial Weight Bearing Percentage or Pounds: WB for stand pivot transfers only   Therapy/Group: Individual  Therapy  Gabrille Kilbride L 12/26/2018, 6:59 AM

## 2018-12-26 NOTE — IPOC Note (Signed)
Overall Plan of Care (IPOC) Patient Details Name: Richard Ferguson MRN: [id number] DOB: Sep 25, 1975  Admitting Diagnosis: Multiple trauma  Hospital Problems: Principal Problem:   Multiple trauma Active Problems:   Trauma   Thrombocytopenia ([place])   Hyponatremia   Acute blood loss anemia   Essential hypertension   Postoperative pain   Class 2 obesity due to excess calories without serious comorbidity with body mass index (BMI) of 38.0 to 38.9 in adult     Functional Problem List: Nursing Medication Management, Pain, Motor, Safety, Skin Integrity  PT Edema, Endurance, Motor, Pain, Balance  OT Balance, Pain, Edema, Skin Integrity  SLP    TR         Basic ADL's: OT Toileting     Advanced  ADL's: OT       Transfers: PT Bed Mobility, Bed to Chair, Furniture  OT Toilet, Tub/Shower     Locomotion: PT Ambulation, Emergency planning/management officer, Stairs(will not be addressed during this admission)     Additional Impairments: OT None  SLP        TR      Anticipated Outcomes Item Anticipated Outcome  Self Feeding no goal set  Swallowing      Basic self-care  (S)  Toileting  (S)   Bathroom Transfers (S)  Bowel/Bladder  Min/mod assist for personal hygiene  Transfers  modified independent  Locomotion  no goals- total assist  Communication     Cognition     Pain  < 4  Safety/Judgment  Mod I   Therapy Plan: PT Intensity: Minimum of 1-2 x/day ,45 to 90 minutes PT Frequency: 5 out of 7 days PT Duration Estimated Length of Stay: 3-5 OT Intensity: Minimum of 1-2 x/day, 45 to 90 minutes OT Frequency: 5 out of 7 days OT Duration/Estimated Length of Stay: 3-5 days     Due to the current state of emergency, patients may not be receiving their 3-hours of Medicare-mandated therapy.   Team Interventions: Nursing Interventions Skin Care/Wound Management, Medication Management, Pain Management, Discharge Planning  PT interventions Discharge planning, DME/adaptive equipment  instruction, Functional mobility training, Pain management, Psychosocial support, Therapeutic Activities, UE/LE Strength taining/ROM, Medical illustrator training, Neuromuscular re-education, Patient/family education, Therapeutic Exercise, Wheelchair propulsion/positioning  OT Interventions Balance/vestibular training, Discharge planning, Pain management, Self Care/advanced ADL retraining, Therapeutic Activities, UE/LE Coordination activities, Patient/family education, Skin care/wound managment, Therapeutic Exercise, UE/LE Strength taining/ROM, Splinting/orthotics, Psychosocial support, DME/adaptive equipment instruction, Community reintegration  SLP Interventions    TR Interventions    SW/CM Interventions Discharge Planning, Psychosocial Support, Patient/Family Education   Barriers to Discharge MD  n/a  Nursing Weight bearing restrictions, Wound Care    PT Inaccessible home environment pt plans to call friends about building ramp ASAP  OT Weight bearing restrictions Pt requires ramp and w/c to be accessible through doorways, as he is only allowed to stand pivot  SLP      SW       Team Discharge Planning: Destination: PT-Home ,OT- Home , SLP-  Projected Follow-up: PT-Home health PT, OT-  Home health OT, SLP-  Projected Equipment Needs: PT-Wheelchair (measurements), Wheelchair cushion (measurements), Rolling walker with 5" wheels, OT- 3 in 1 bedside comode, SLP-  Equipment Details: PT-bil forearm supports for RW, OT-  Patient/family involved in discharge planning: PT- Patient,  OT-Patient, SLP-   MD ELOS: 8/4 Medical Rehab Prognosis:  Excellent Assessment: The patient has been admitted for CIR therapies with the diagnosis of polytrauma. The team will be addressing functional mobility, strength, stamina, balance, safety,  adaptive techniques and equipment, self-care, bowel and bladder mgt, patient and caregiver education, pain mgt, ortho precautions, wound care. Goals have been set at mod I  w/c level for mobility and supervision for self-care.   Due to the current state of emergency, patients may not be receiving their 3 hours per day of Medicare-mandated therapy.    Ranelle OysterZachary T. Nai Borromeo, MD, FAAPMR      See Team Conference Notes for weekly updates to the plan of care

## 2018-12-26 NOTE — Progress Notes (Signed)
Pt discharged to home with all DME. Pt has splints in place on bilateral upper extremities. Discharge instructions were provided by Reesa Chew, PA.

## 2018-12-26 NOTE — Progress Notes (Signed)
Social Work Discharge Note   The overall goal for the admission was met for:   Discharge location: Yes - home with family to assist as needed  Length of Stay: Yes - 3 days  Discharge activity level: Yes - supervision/mod I  Home/community participation: Yes  Services provided included: MD, RD, PT, OT, RN, Pharmacy and SW  Financial Services: Other: self pay  Follow-up services arranged: DME: 20'" x18" lightweight w/c with ELRs and basic cushion; drop arm commode; tub transfer bench; bilateral platform wide rolling walker from [employer].  Pt will wait to do outpt therapy until his weight bearing restrictions are lifted. and Patient/Family has no preference for HH/DME agencies  Comments (or additional information): Pt's mother came for family education and pt is independent to direct any other care needed.  Patient/Family verbalized understanding of follow-up arrangements: Yes  Individual responsible for coordination of the follow-up plan: pt and his dtr, Lonn Georgia [phone] and former wife (they are still close) , Vicente Males [phone]  Confirmed correct DME delivered: Trey Sailors 12/26/2018    Tsering Leaman, Silvestre Mesi

## 2018-12-26 NOTE — Progress Notes (Signed)
Physical Therapy Discharge Summary  Patient Details  Name: Richard Ferguson MRN: [id number] Date of Birth: Jun 23, 1975  Today's Date: 12/26/2018 PT Individual Time: 1000-1050 PT Individual Time Calculation (min): 50 min    Patient has met 8 of 8 long term goals due to improved activity tolerance, improved balance, improved postural control and ability to compensate for deficits.  Patient to discharge at dependent in a wheelchair with the ability to stand pivot only with B PFRW due to [facility] precautions level Supervision to mod I overall with min A needed for bed mobility and CGA needed for car transfers.   Patient's care partner is independent to provide the necessary physical assistance at discharge. Patient's mother has completed hands on family education and is safe to assist the pt upon d/c home.  Reasons goals not met: Patient has met all rehab goals.  Recommendation:  Patient will benefit from ongoing skilled PT services in outpatient setting once healing has taken place and WBing restrictions are limited to continue to advance safe functional mobility, address ongoing impairments in strength, endurance, safety, independence with functional mobility, and minimize fall risk.  Equipment: 20x18 manual w/c with B ELR; wide B PFRW  Reasons for discharge: treatment goals met and discharge from hospital  Patient/family agrees with progress made and goals achieved: Yes   Skilled Intervention: Pt received seated in w/c in room, equipment has just arrived. Pt agreeable to participate in therapy session and allow therapist to assist with setting up his equipment. Stand pivot transfer w/c to bed mod I. Assisted pt with adjusting w/c parts for improved fit and adjusting RW height and platform heights for improved fit. Pt is able to perform stand pivot transfers w/c to/from bed with use of B PFRW at mod I level. Reviewed handout HEP that pt has, pt demos good understanding of and ability to perform HEP. Pt  requests to lay down at end of session. Sit to supine min A for LLE management. Pt left semi-reclined in bed with needs in reach at end of session. Pt is safe to d/c home this PM.  PT Discharge Precautions/Restrictions Precautions Precautions: Fall;Other (comment) Required Braces or Orthoses: Splint/Cast;Other Brace Splint/Cast: bilat wrist splint Restrictions Weight Bearing Restrictions: Yes RUE Weight Bearing: Non weight bearing LUE Weight Bearing: Non weight bearing RLE Weight Bearing: Non weight bearing LLE Weight Bearing: Weight bearing as tolerated LLE Partial Weight Bearing Percentage or Pounds: WB for stand pivot transfers only Vision/Perception  Perception Perception: Within Functional Limits Praxis Praxis: Intact  Cognition Overall Cognitive Status: Within Functional Limits for tasks assessed Arousal/Alertness: Awake/alert Orientation Level: Oriented X4 Attention: Selective Selective Attention: Appears intact Memory: Appears intact Awareness: Appears intact Problem Solving: Appears intact Safety/Judgment: Appears intact Sensation Sensation Light Touch: Appears Intact Proprioception: Appears Intact Coordination Gross Motor Movements are Fluid and Coordinated: No Fine Motor Movements are Fluid and Coordinated: No Coordination and Movement Description: impaired 2/2 B UE/LE and pelvic injuries Motor  Motor Motor: Within Functional Limits Motor - Discharge Observations: generalized weakness 2/2 pain and trauma  Mobility Bed Mobility Bed Mobility: Rolling Right;Rolling Left;Supine to Sit;Sit to Supine Rolling Right: Independent with assistive device Rolling Left: Independent with assistive device Supine to Sit: Independent with assistive device Sit to Supine: Minimal Assistance - Patient > 75%(LLE management) Transfers Transfers: Stand to Sit;Stand Pivot Transfers Sit to Stand: Independent with assistive device Stand to Sit: Independent with assistive  device Stand Pivot Transfers: Independent with assistive device Transfer (Assistive device): Bilateral platform walker Locomotion  Gait  Ambulation: No Gait Gait: No Stairs / Additional Locomotion Stairs: No Wheelchair Mobility Wheelchair Mobility: No  Trunk/Postural Assessment  Cervical Assessment Cervical Assessment: Within Functional Limits Thoracic Assessment Thoracic Assessment: Within Functional Limits Lumbar Assessment Lumbar Assessment: Within Functional Limits Postural Control Postural Control: Within Functional Limits  Balance Balance Balance Assessed: Yes Static Sitting Balance Static Sitting - Balance Support: Feet supported Static Sitting - Level of Assistance: 7: Independent Dynamic Sitting Balance Dynamic Sitting - Balance Support: Feet supported Dynamic Sitting - Level of Assistance: 7: Independent Static Standing Balance Static Standing - Balance Support: Bilateral upper extremity supported;During functional activity Static Standing - Level of Assistance: 6: Modified independent (Device/Increase time) Dynamic Standing Balance Dynamic Standing - Balance Support: During functional activity;Bilateral upper extremity supported Dynamic Standing - Level of Assistance: 6: Modified independent (Device/Increase time) Extremity Assessment   RLE Assessment RLE Assessment: Exceptions to [facility] General Strength Comments: grossly in sitting: 3-/5 hip flexion, 3-/5 knee ext, 5/5 ankle DF LLE Assessment LLE Assessment: Exceptions to [facility] General Strength Comments: grossly in sitting 2-/5 hip flexion, 2+/5 knee ext, 4/5 ankle df     Excell Seltzer, PT, DPT 12/26/2018, 12:48 PM

## 2018-12-27 NOTE — Progress Notes (Signed)
PT note  Late entry on 12/27/18 for 12/24/18  Late entry for care tool scores during PT evaluation on 12/23/08,  into flowsheets.  Georjean Mode, PT

## 2019-01-05 ENCOUNTER — Encounter: Payer: Self-pay | Admitting: Physical Medicine and Rehabilitation

## 2019-01-30 ENCOUNTER — Other Ambulatory Visit: Payer: Self-pay | Admitting: Family Medicine

## 2019-02-03 ENCOUNTER — Other Ambulatory Visit: Payer: Self-pay | Admitting: Family Medicine

## 2019-02-24 ENCOUNTER — Other Ambulatory Visit: Payer: Self-pay

## 2019-02-24 ENCOUNTER — Emergency Department
Admission: EM | Admit: 2019-02-24 | Discharge: 2019-02-25 | Disposition: A | Discharge status: Home / Self Care | Payer: Self-pay | Attending: Emergency Medicine | Admitting: Emergency Medicine

## 2019-02-24 ENCOUNTER — Emergency Department: Payer: Self-pay

## 2019-02-24 DIAGNOSIS — Z79899 Other long term (current) drug therapy: Secondary | ICD-10-CM | POA: Insufficient documentation

## 2019-02-24 DIAGNOSIS — T63481A Toxic effect of venom of other arthropod, accidental (unintentional), initial encounter: Secondary | ICD-10-CM | POA: Insufficient documentation

## 2019-02-24 DIAGNOSIS — W57XXXA Bitten or stung by nonvenomous insect and other nonvenomous arthropods, initial encounter: Secondary | ICD-10-CM | POA: Insufficient documentation

## 2019-02-24 DIAGNOSIS — F17210 Nicotine dependence, cigarettes, uncomplicated: Secondary | ICD-10-CM | POA: Insufficient documentation

## 2019-02-24 DIAGNOSIS — Z7901 Long term (current) use of anticoagulants: Secondary | ICD-10-CM | POA: Insufficient documentation

## 2019-02-24 DIAGNOSIS — I10 Essential (primary) hypertension: Secondary | ICD-10-CM | POA: Insufficient documentation

## 2019-02-24 DIAGNOSIS — J449 Chronic obstructive pulmonary disease, unspecified: Secondary | ICD-10-CM | POA: Insufficient documentation

## 2019-02-24 DIAGNOSIS — Y939 Activity, unspecified: Secondary | ICD-10-CM | POA: Insufficient documentation

## 2019-02-24 DIAGNOSIS — Y999 Unspecified external cause status: Secondary | ICD-10-CM | POA: Insufficient documentation

## 2019-02-24 DIAGNOSIS — Y929 Unspecified place or not applicable: Secondary | ICD-10-CM | POA: Insufficient documentation

## 2019-02-24 LAB — COMPREHENSIVE METABOLIC PANEL WITH GFR
ALT: 15 U/L (ref 0–44)
AST: 18 U/L (ref 15–41)
Albumin: 4.4 g/dL (ref 3.5–5.0)
Alkaline Phosphatase: 119 U/L (ref 38–126)
Anion gap: 9 (ref 5–15)
BUN: 12 mg/dL (ref 6–20)
CO2: 24 mmol/L (ref 22–32)
Calcium: 8.9 mg/dL (ref 8.9–10.3)
Chloride: 101 mmol/L (ref 98–111)
Creatinine, Ser: 0.87 mg/dL (ref 0.61–1.24)
GFR calc Af Amer: >60 mL/min (ref >60)
GFR calc non Af Amer: >60 mL/min (ref >60)
Glucose, Bld: 98 mg/dL (ref 70–99)
Potassium: 3.8 mmol/L (ref 3.5–5.1)
Sodium: 134 mmol/L — ABNORMAL LOW (ref 135–145)
Total Bilirubin: 0.7 mg/dL (ref 0.3–1.2)
Total Protein: 7.9 g/dL (ref 6.5–8.1)

## 2019-02-24 LAB — CBC WITH DIFFERENTIAL/PLATELET
Abs Immature Granulocytes: 0.05 K/uL (ref 0.00–0.07)
Basophils Absolute: 0.1 K/uL (ref 0.0–0.1)
Basophils Relative: 1 %
Eosinophils Absolute: 0.1 K/uL (ref 0.0–0.5)
Eosinophils Relative: 1 %
HCT: 44.5 % (ref 39.0–52.0)
Hemoglobin: 14.7 g/dL (ref 13.0–17.0)
Immature Granulocytes: 0 %
Lymphocytes Relative: 30 %
Lymphs Abs: 3.4 K/uL (ref 0.7–4.0)
MCH: 28.5 pg (ref 26.0–34.0)
MCHC: 33 g/dL (ref 30.0–36.0)
MCV: 86.2 fL (ref 80.0–100.0)
Monocytes Absolute: 0.8 K/uL (ref 0.1–1.0)
Monocytes Relative: 7 %
Neutro Abs: 6.9 K/uL (ref 1.7–7.7)
Neutrophils Relative %: 61 %
Platelets: 258 K/uL (ref 150–400)
RBC: 5.16 MIL/uL (ref 4.22–5.81)
RDW: 12.8 % (ref 11.5–15.5)
WBC: 11.4 K/uL — ABNORMAL HIGH (ref 4.0–10.5)
nRBC: 0 % (ref 0.0–0.2)

## 2019-02-24 LAB — PROTIME-INR
INR: 1 (ref 0.8–1.2)
Prothrombin Time: 13.1 seconds (ref 11.4–15.2)

## 2019-02-24 LAB — TROPONIN I (HIGH SENSITIVITY)
Troponin I (High Sensitivity): 6 ng/L (ref <18)
Troponin I (High Sensitivity): 6 ng/L (ref <18)

## 2019-02-24 MED ORDER — PREDNISONE 20 MG PO TABS: 40.0000 mg | ORAL_TABLET | Freq: Every day | ORAL | 0 refills | Status: DC

## 2019-02-24 MED ORDER — HYDROMORPHONE HCL 1 MG/ML IJ SOLN
1.0000 mg | Freq: Once | INTRAMUSCULAR | Status: AC
  Administered 2019-02-24: 19:00:00 1 mg via INTRAVENOUS
  Filled 2019-02-24: qty 1

## 2019-02-24 MED ORDER — HYDROMORPHONE HCL 1 MG/ML IJ SOLN
2.0000 mg | INTRAMUSCULAR | Status: AC
  Administered 2019-02-24: 2 mg via INTRAVENOUS
  Filled 2019-02-24: qty 2

## 2019-02-24 MED ORDER — DIPHENHYDRAMINE HCL 25 MG PO TABS: 25.0000 mg | ORAL_TABLET | Freq: Four times a day (QID) | ORAL | 0 refills | Status: DC

## 2019-02-24 MED ORDER — LORAZEPAM 2 MG/ML IJ SOLN
2.0000 mg | Freq: Once | INTRAMUSCULAR | Status: AC
  Administered 2019-02-24: 2 mg via INTRAVENOUS
  Filled 2019-02-24: qty 1

## 2019-02-24 MED ORDER — AMLODIPINE BESYLATE 10 MG PO TABS: 10.0000 mg | ORAL_TABLET | Freq: Every day | ORAL | 2 refills | Status: DC

## 2019-02-24 MED ORDER — DIPHENHYDRAMINE HCL 50 MG/ML IJ SOLN
50.0000 mg | Freq: Once | INTRAMUSCULAR | Status: AC
  Administered 2019-02-24: 50 mg via INTRAVENOUS
  Filled 2019-02-24: qty 1

## 2019-02-24 MED ORDER — HYDROMORPHONE HCL 1 MG/ML IJ SOLN
1.0000 mg | Freq: Once | INTRAMUSCULAR | Status: AC
  Administered 2019-02-24: 1 mg via INTRAVENOUS
  Filled 2019-02-24: qty 1

## 2019-02-24 MED ORDER — OXYCODONE-ACETAMINOPHEN 5-325 MG PO TABS: 1.0000 | ORAL_TABLET | Freq: Four times a day (QID) | ORAL | 0 refills | Status: AC | PRN

## 2019-02-24 MED ORDER — AMLODIPINE BESYLATE 5 MG PO TABS
10.0000 mg | ORAL_TABLET | Freq: Once | ORAL | Status: AC
  Administered 2019-02-24: 10 mg via ORAL
  Filled 2019-02-24: qty 2

## 2019-02-24 MED ORDER — METHYLPREDNISOLONE SODIUM SUCC 125 MG IJ SOLR
125.0000 mg | Freq: Once | INTRAMUSCULAR | Status: AC
  Administered 2019-02-24: 125 mg via INTRAVENOUS
  Filled 2019-02-24: qty 2

## 2019-02-24 MED ORDER — IOHEXOL 350 MG/ML SOLN
75.0000 mL | Freq: Once | INTRAVENOUS | Status: AC | PRN
  Administered 2019-02-24: 75 mL via INTRAVENOUS

## 2019-02-24 MED ORDER — FAMOTIDINE IN NACL 20-0.9 MG/50ML-% IV SOLN
20.0000 mg | Freq: Once | INTRAVENOUS | Status: AC
  Administered 2019-02-24: 20 mg via INTRAVENOUS
  Filled 2019-02-24: qty 50

## 2019-02-24 MED ORDER — FENTANYL CITRATE (PF) 100 MCG/2ML IJ SOLN
50.0000 ug | Freq: Once | INTRAMUSCULAR | Status: AC
  Administered 2019-02-24: 19:00:00 50 ug via INTRAVENOUS
  Filled 2019-02-24: qty 2

## 2019-02-24 NOTE — ED Notes (Signed)
X-ray at bedside
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
## 2019-02-24 NOTE — ED Notes (Signed)
Patient transported to CT 
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
## 2019-02-24 NOTE — ED Notes (Signed)
Dr. Stafford at bedside.  

## 2020-04-22 ENCOUNTER — Other Ambulatory Visit: Payer: Self-pay | Admitting: Nurse Practitioner

## 2020-04-22 DIAGNOSIS — U071 COVID-19: Secondary | ICD-10-CM

## 2020-04-22 NOTE — Progress Notes (Signed)
I connected by phone with Richard Ferguson on 04/22/2020 at 8:27 PM to discuss the potential use of a treatment for mild to moderate COVID-19 viral infection in non-hospitalized patients.  This patient is a 44 y.o. male that meets the FDA criteria for Emergency Use Authorization of bamlanivimab/etesevimab, casirivimab\imdevimab, or sotrovimab  Has a (+) direct SARS-CoV-2 viral test result  Has mild or moderate COVID-19   Is ? 43 years of age and weighs ? 40 kg  Is NOT hospitalized due to COVID-19  Is NOT requiring oxygen therapy or requiring an increase in baseline oxygen flow rate due to COVID-19  Is within 10 days of symptom onset  Has at least one of the high risk factor(s) for progression to severe COVID-19 and/or hospitalization as defined in EUA.  Specific high risk criteria : BMI > 25, Cardiovascular disease or hypertension and Other high risk medical condition per CDC:  smoker   I have spoken and communicated the following to the patient or parent/caregiver:  1. FDA has authorized the emergency use of bamlanivimab/etesevimab, casirivimab\imdevimab, or sotrovimab for the treatment of mild to moderate COVID-19 in adults and pediatric patients with positive results of direct SARS-CoV-2 viral testing who are 49 years of age and older weighing at least 40 kg, and who are at high risk for progressing to severe COVID-19 and/or hospitalization.  2. The significant known and potential risks and benefits of bamlanivimab/etesevimab, casirivimab\imdevimab, or sotrovimab, and the extent to which such potential risks and benefits are unknown.  3. Information on available alternative treatments and the risks and benefits of those alternatives, including clinical trials.  4. Patients treated with bamlanivimab/etesevimab, casirivimab\imdevimab, or sotrovimab should continue to self-isolate and use infection control measures (e.g., wear mask, isolate, social distance, avoid sharing personal items,  clean and disinfect "high touch" surfaces, and frequent handwashing) according to CDC guidelines.   5. The patient or parent/caregiver has the option to accept or refuse bamlanivimab/etesevimab, casirivimab\imdevimab, or sotrovimab.  After reviewing this information with the patient, the patient has agreed to receive one of the available covid 19 monoclonal antibodies and will be provided an appropriate fact sheet prior to infusion.Consuello Masse, DNP, AGNP-C [phone] (Infusion Center Hotline)

## 2020-04-24 ENCOUNTER — Ambulatory Visit (HOSPITAL_COMMUNITY)
Admission: RE | Admit: 2020-04-24 | Discharge: 2020-04-24 | Disposition: A | Discharge status: Home / Self Care | Payer: HRSA Program | Source: Ambulatory Visit | Attending: Pulmonary Disease | Admitting: Pulmonary Disease

## 2020-04-24 DIAGNOSIS — F17200 Nicotine dependence, unspecified, uncomplicated: Secondary | ICD-10-CM | POA: Insufficient documentation

## 2020-04-24 DIAGNOSIS — U071 COVID-19: Secondary | ICD-10-CM | POA: Diagnosis present

## 2020-04-24 MED ORDER — EPINEPHRINE 0.3 MG/0.3ML IJ SOAJ: 0.3000 mg | Freq: Once | INTRAMUSCULAR | Status: DC | PRN

## 2020-04-24 MED ORDER — ALBUTEROL SULFATE HFA 108 (90 BASE) MCG/ACT IN AERS: 2.0000 | INHALATION_SPRAY | Freq: Once | RESPIRATORY_TRACT | Status: DC | PRN

## 2020-04-24 MED ORDER — SODIUM CHLORIDE 0.9 % IV SOLN: INTRAVENOUS | Status: DC | PRN

## 2020-04-24 MED ORDER — METHYLPREDNISOLONE SODIUM SUCC 125 MG IJ SOLR: 125.0000 mg | Freq: Once | INTRAMUSCULAR | Status: DC | PRN

## 2020-04-24 MED ORDER — FAMOTIDINE IN NACL 20-0.9 MG/50ML-% IV SOLN: 20.0000 mg | Freq: Once | INTRAVENOUS | Status: DC | PRN

## 2020-04-24 MED ORDER — SOTROVIMAB 500 MG/8ML IV SOLN
500.0000 mg | Freq: Once | INTRAVENOUS | Status: AC
  Administered 2020-04-24: 500 mg via INTRAVENOUS

## 2020-04-24 MED ORDER — DIPHENHYDRAMINE HCL 50 MG/ML IJ SOLN: 50.0000 mg | Freq: Once | INTRAMUSCULAR | Status: DC | PRN

## 2020-04-24 NOTE — Progress Notes (Signed)
Patient reviewed Fact Sheet for Patients, Parents, and Caregivers for Emergency Use Authorization (EUA) of Sotrovimab for the Treatment of Coronavirus. Patient also reviewed and is agreeable to the estimated cost of treatment. Patient is agreeable to proceed.   

## 2020-04-24 NOTE — Progress Notes (Signed)
Diagnosis: COVID-19  Physician: Dr. Patrick Wright  Procedure: [facility]id Infusion Clinic Med: Sotrovimab infusion - Provided patient with sotrovimab fact sheet for patients, parents, and caregivers prior to infusion.   Complications: No immediate complications noted  Discharge: Discharged home    

## 2020-04-24 NOTE — Discharge Instructions (Signed)
10 Things You Can Do to Manage Your COVID-19 Symptoms at Home If you have possible or confirmed COVID-19: 1. Stay home from work and school. And stay away from other public places. If you must go out, avoid using any kind of public transportation, ridesharing, or taxis. 2. Monitor your symptoms carefully. If your symptoms get worse, call your healthcare provider immediately. 3. Get rest and stay hydrated. 4. If you have a medical appointment, call the healthcare provider ahead of time and tell them that you have or may have COVID-19. 5. For medical emergencies, call 911 and notify the dispatch personnel that you have or may have COVID-19. 6. Cover your cough and sneezes with a tissue or use the inside of your elbow. 7. Wash your hands often with soap and water for at least 20 seconds or clean your hands with an alcohol-based hand sanitizer that contains at least 60% alcohol. 8. As much as possible, stay in a specific room and away from other people in your home. Also, you should use a separate bathroom, if available. If you need to be around other people in or outside of the home, wear a mask. 9. Avoid sharing personal items with other people in your household, like dishes, towels, and bedding. 10. Clean all surfaces that are touched often, like counters, tabletops, and doorknobs. Use household cleaning sprays or wipes according to the label instructions. cdc.gov/coronavirus 11/22/2018 This information is not intended to replace advice given to you by your health care provider. Make sure you discuss any questions you have with your health care provider. Document Revised: 04/26/2019 Document Reviewed: 04/26/2019 Elsevier Patient Education  2020 Elsevier Inc. What types of side effects do monoclonal antibody drugs cause?  Common side effects  In general, the more common side effects caused by monoclonal antibody drugs include: . Allergic reactions, such as hives or itching . Flu-like signs and  symptoms, including chills, fatigue, fever, and muscle aches and pains . Nausea, vomiting . Diarrhea . Skin rashes . Low blood pressure   The CDC is recommending patients who receive monoclonal antibody treatments wait at least 90 days before being vaccinated.  Currently, there are no data on the safety and efficacy of mRNA COVID-19 vaccines in persons who received monoclonal antibodies or convalescent plasma as part of COVID-19 treatment. Based on the estimated half-life of such therapies as well as evidence suggesting that reinfection is uncommon in the 90 days after initial infection, vaccination should be deferred for at least 90 days, as a precautionary measure until additional information becomes available, to avoid interference of the antibody treatment with vaccine-induced immune responses. If you have any questions or concerns after the infusion please call the Advanced Practice Provider on call at [phone]. This number is ONLY intended for your use regarding questions or concerns about the infusion post-treatment side-effects.  Please do not provide this number to others for use. For return to work notes please contact your primary care provider.   If someone you know is interested in receiving treatment please have them call the COVID hotline at [phone].   
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
## 2020-07-25 IMAGING — RF RIGHT WRIST - COMPLETE 3+ VIEW
1 series · 11 of 11 positions shown · non-contrast
Comparison: 12/17/2018

CLINICAL DATA: ORIF right wrist fracture

EXAM:
RIGHT WRIST - COMPLETE 3+ VIEW

[Series 1: run · 11 of 11 slices shown]
[im 1/11]
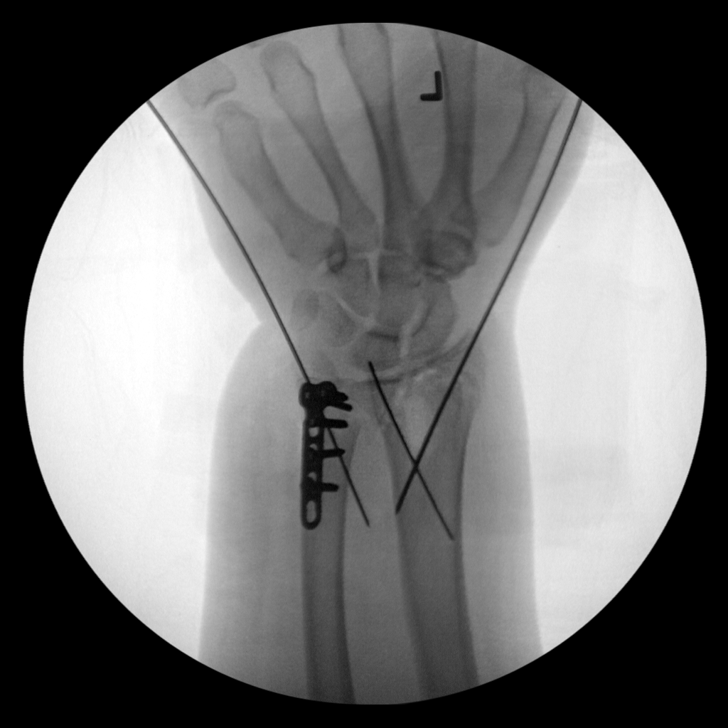
[im 2/11]
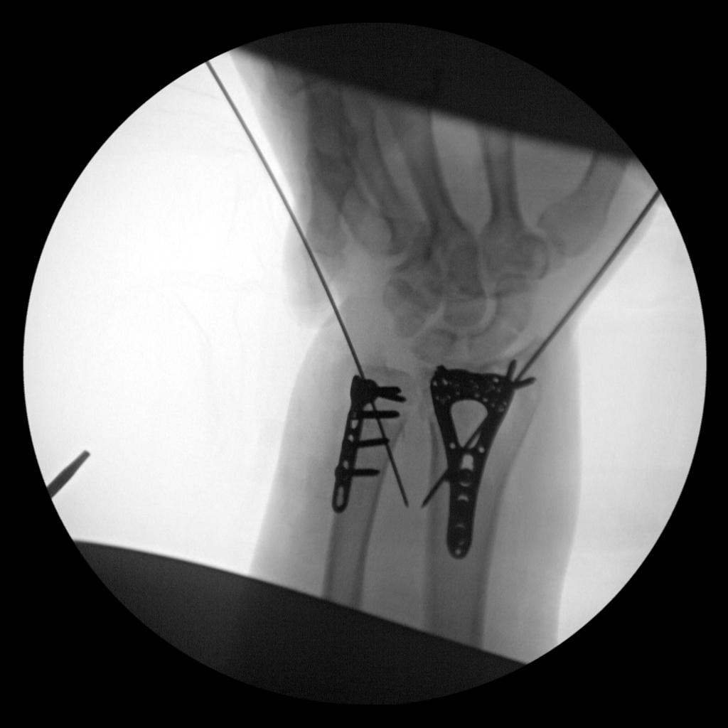
[im 3/11]
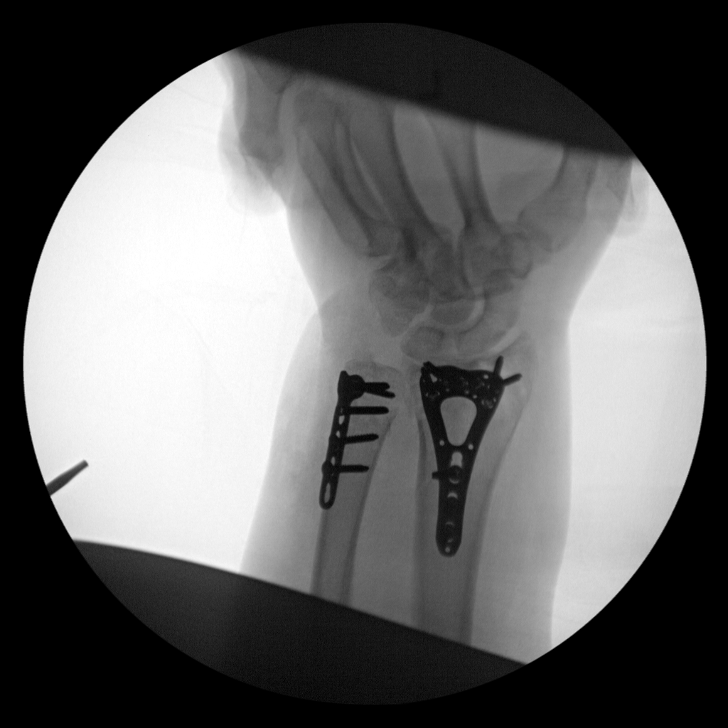
[im 4/11]
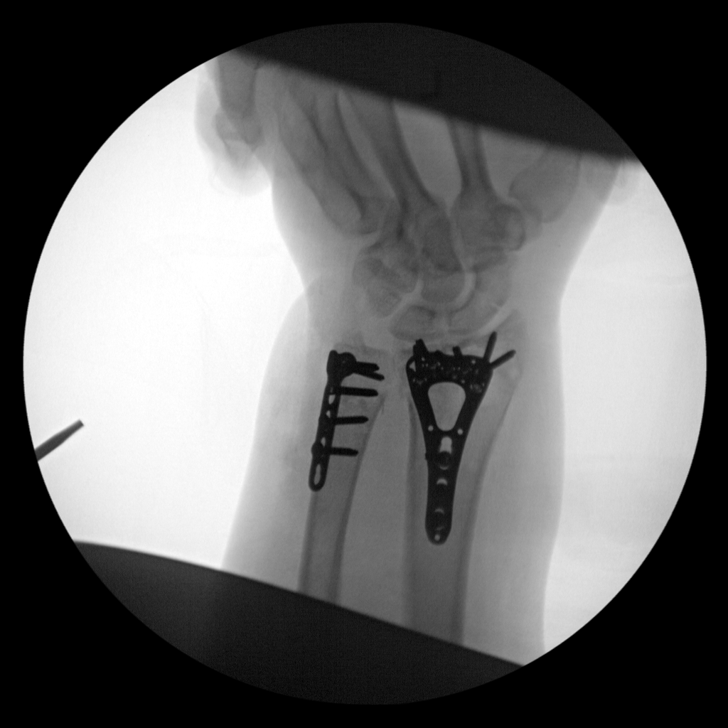
[im 5/11]
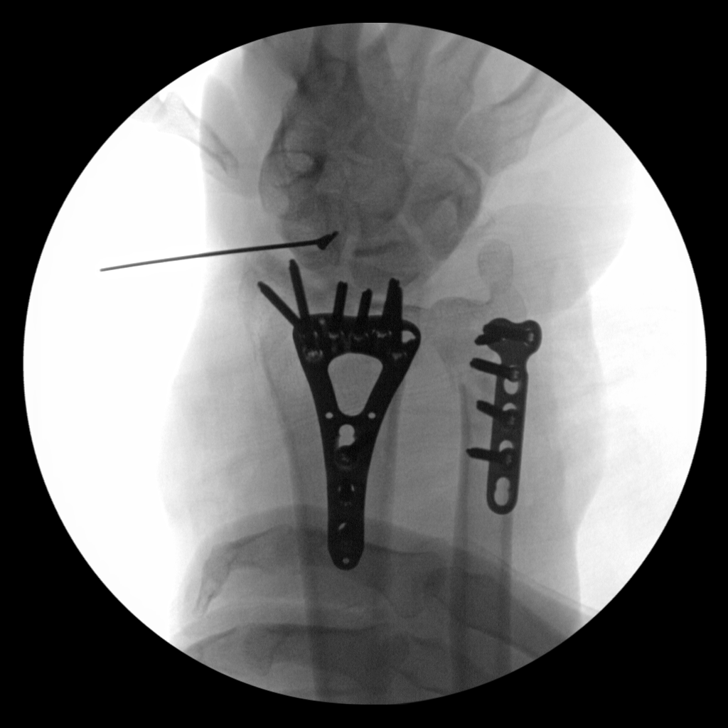
[im 6/11]
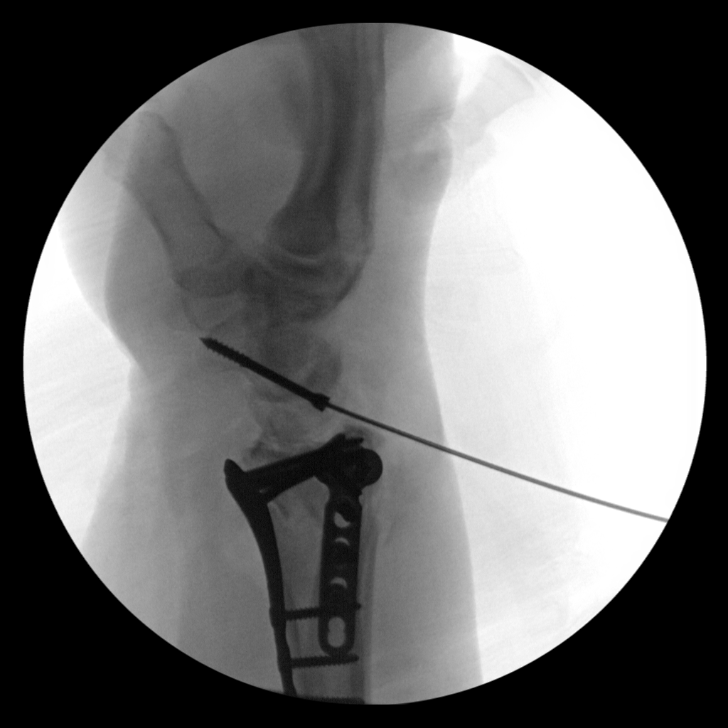
[im 7/11]
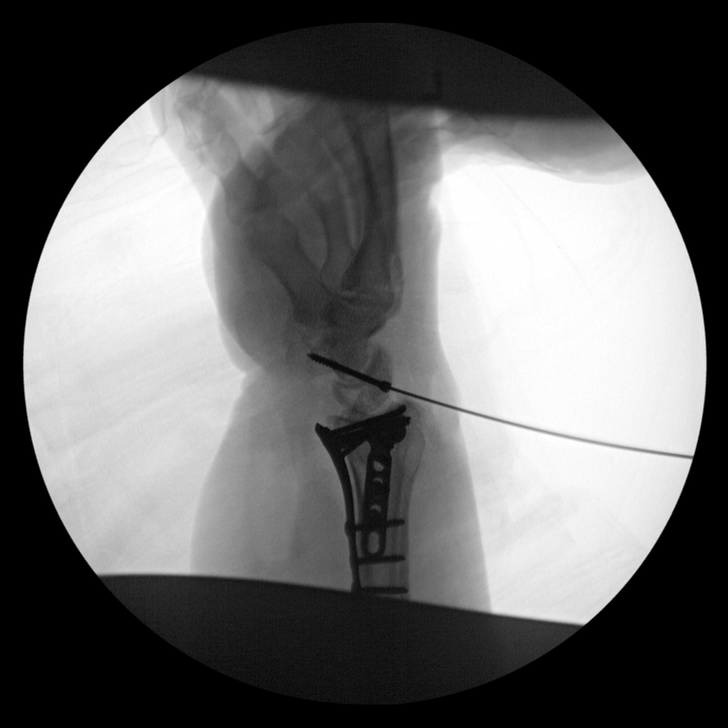
[im 8/11]
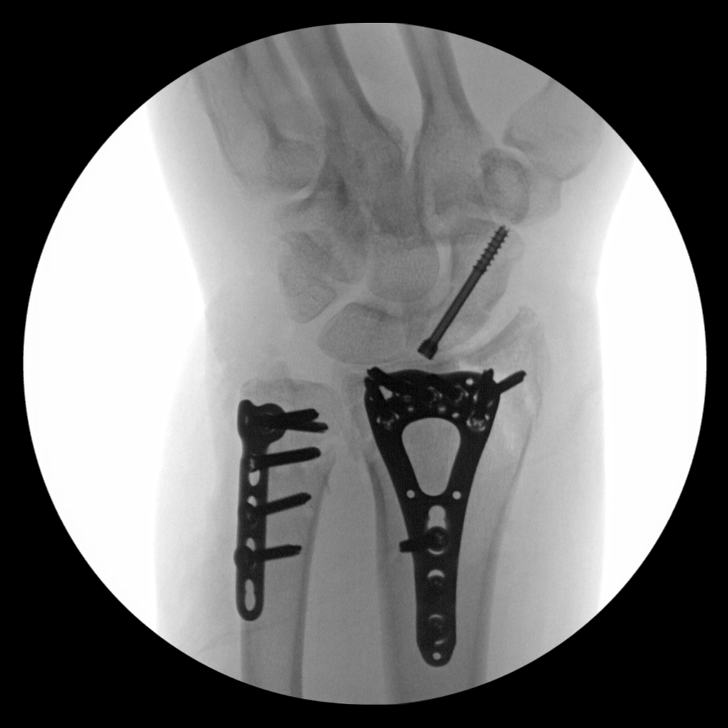
[im 9/11]
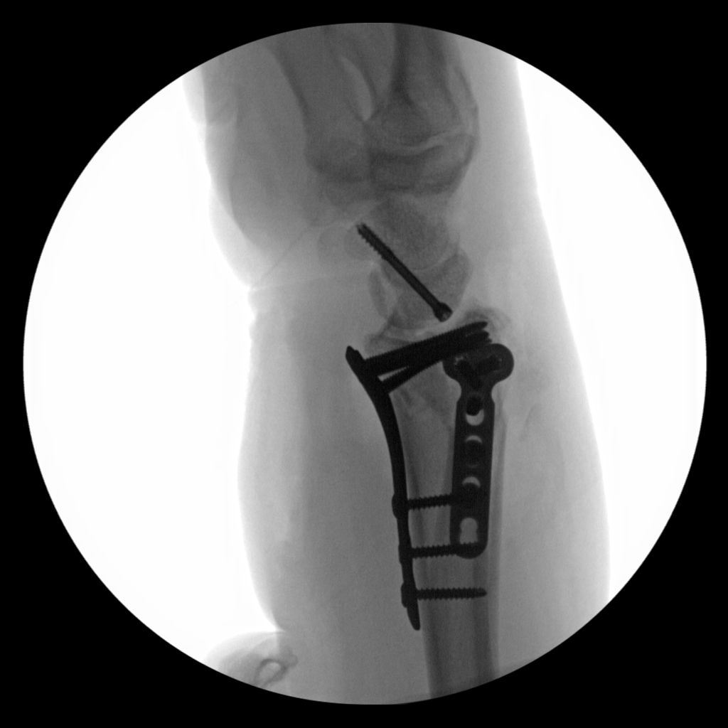
[im 10/11]
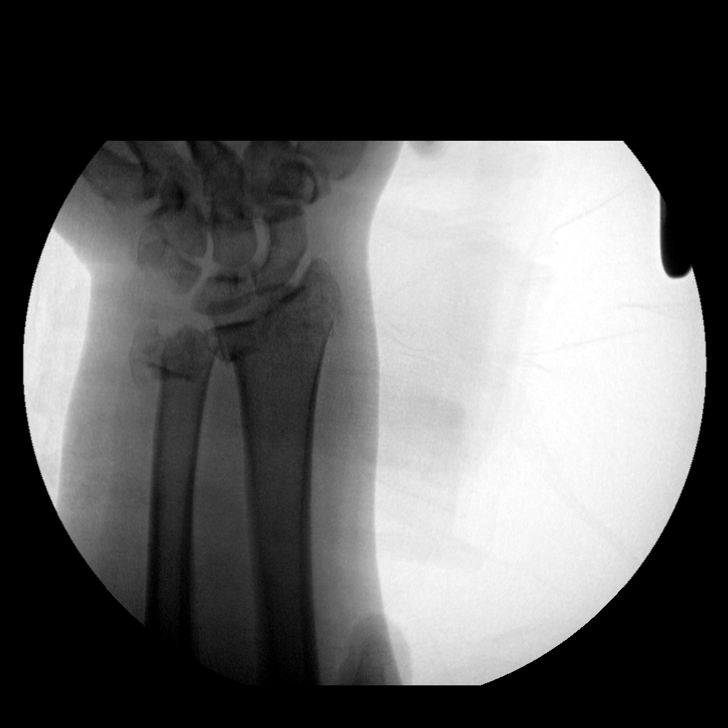
[im 11/11]
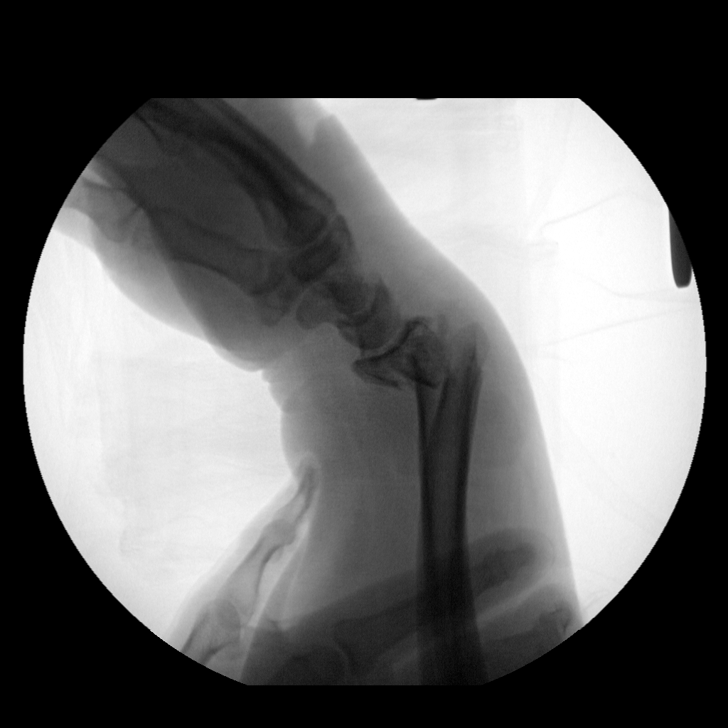

[11 of 11 positions shown; findings below may reference images not displayed]

FINDINGS: C-arm images of the right wrist were obtained in the operating room.
Fracture distal radial and ulna reduced and fixed with plate and
screw fixation. Distal radial fracture fixed with ventral plate and
screws. Lateral plate on the distal ulnar fracture. Screw placement
across the navicular fracture. Satisfactory hardware positioning.
IMPRESSION: Satisfactory plate fixation of distal radial distal ulnar fractures

Screw fixation of navicular fracture.

## 2020-07-25 IMAGING — RF JUDET PELVIS - 3+ VIEW
1 series · 15 of 16 positions shown · non-contrast
Comparison: 12/17/2018.

CLINICAL DATA: ORIF pelvic fractures.

EXAM:
JUDET PELVIS - 3+ VIEW

[Series 1: run · 15 of 16 slices shown]
[im 1/16]
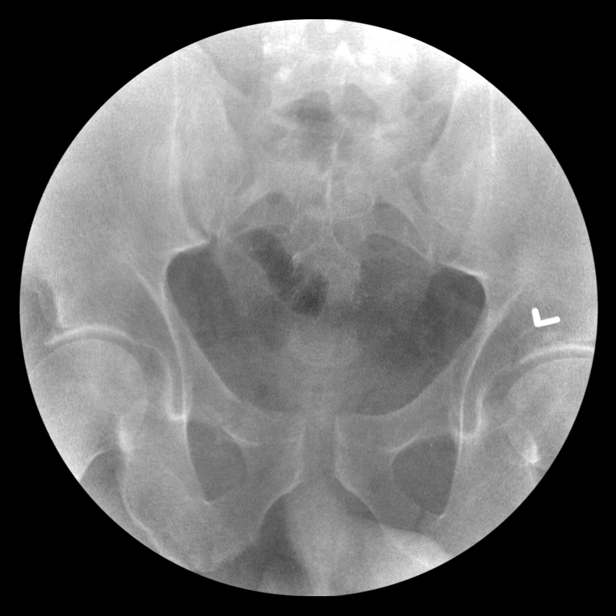
[im 2/16]
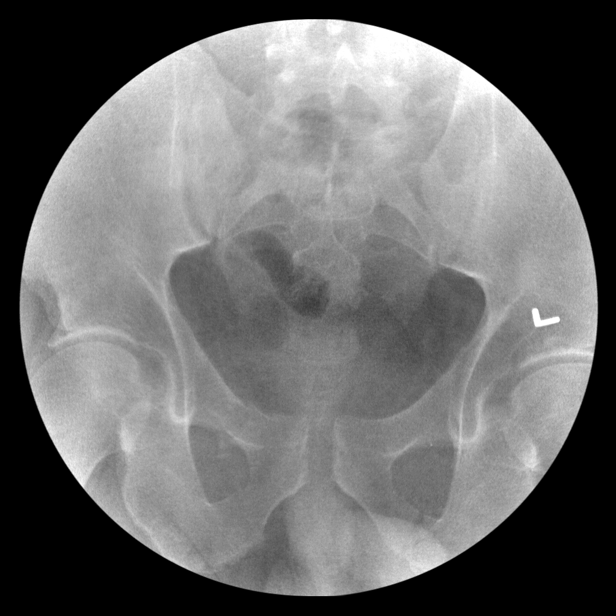
[im 3/16]
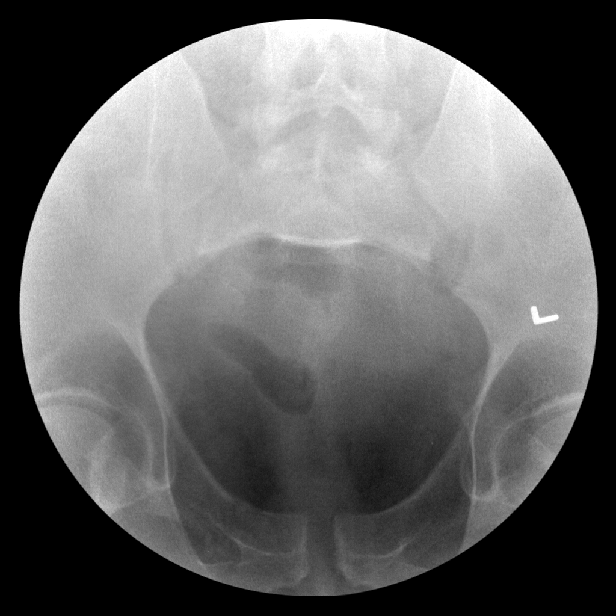
[im 4/16]
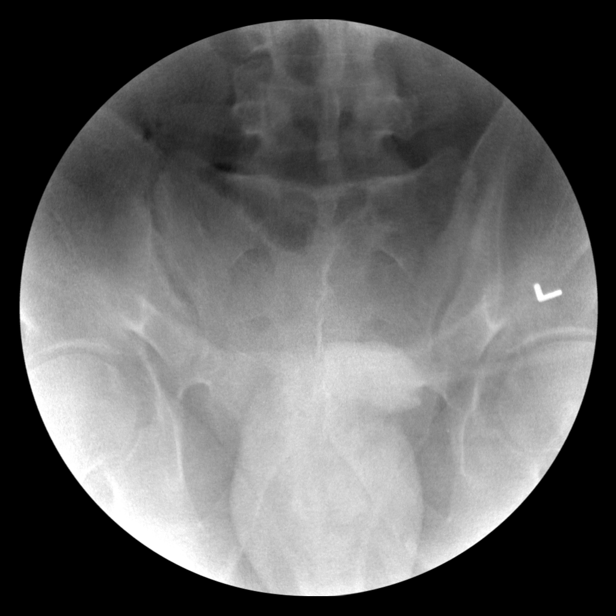
[im 5/16]
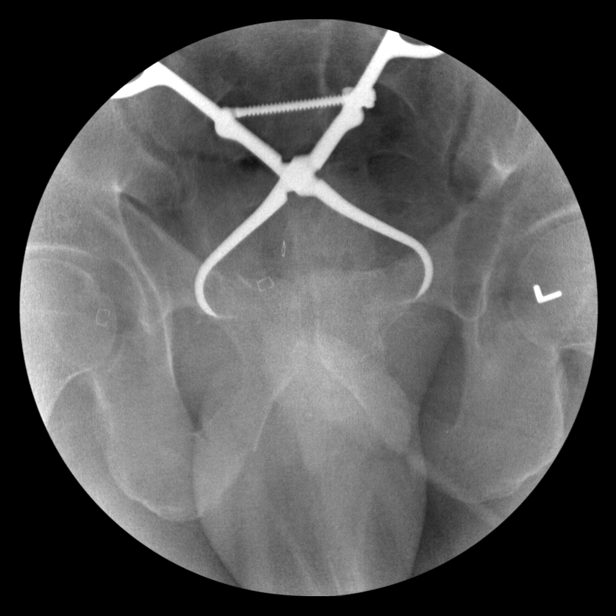
[im 6/16]
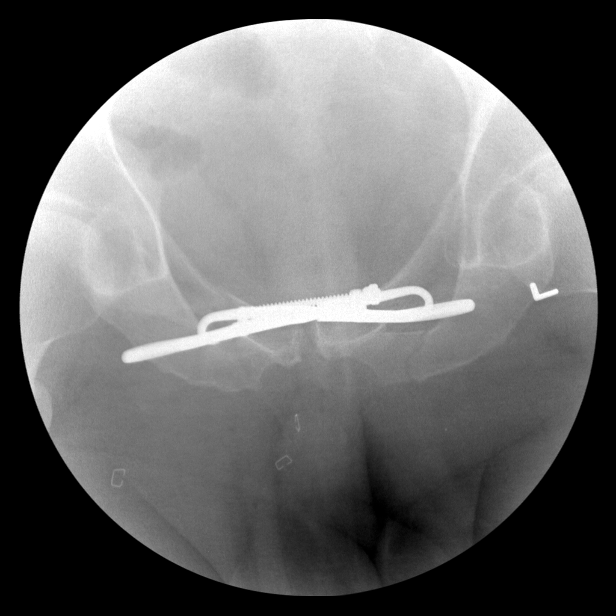
[im 7/16]
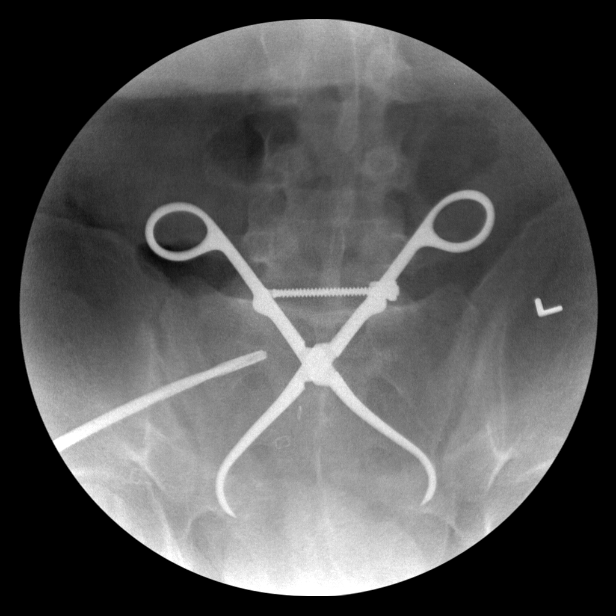
[im 9/16]
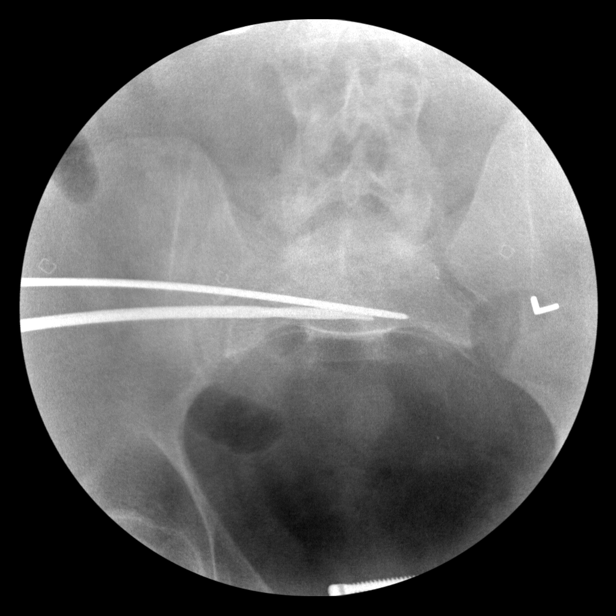
[im 10/16]
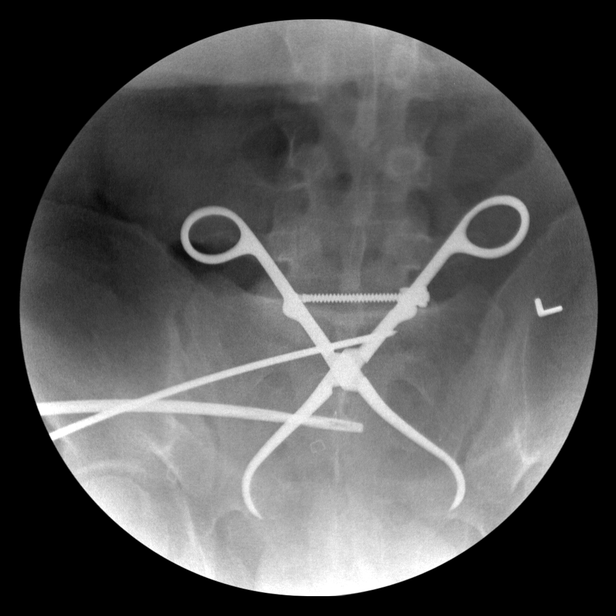
[im 11/16]
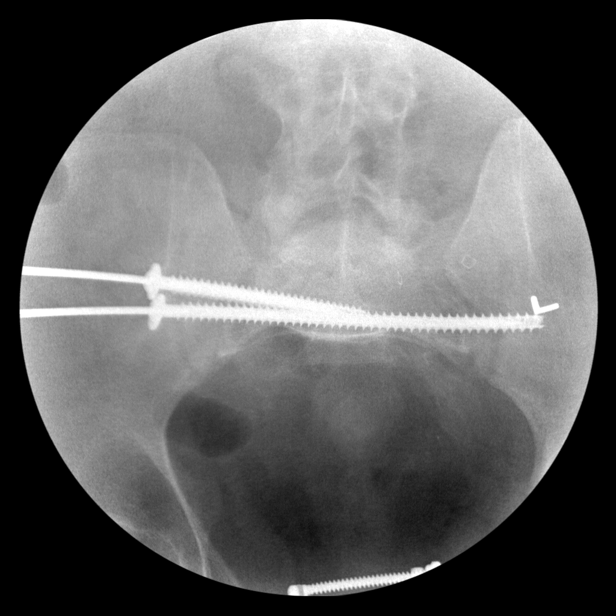
[im 12/16]
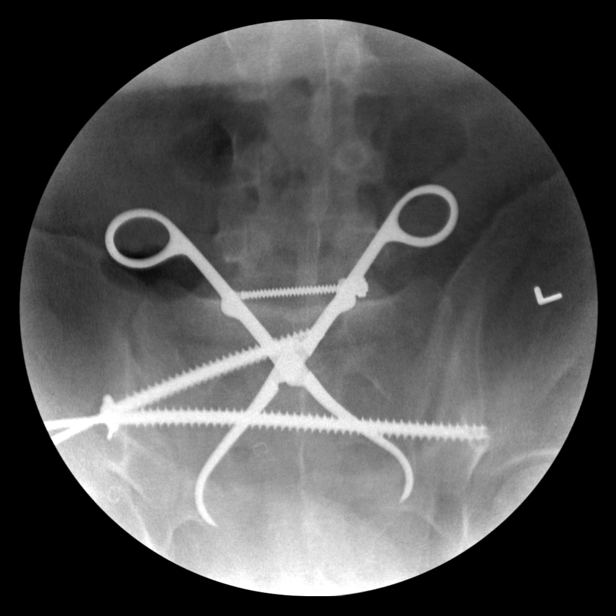
[im 13/16]
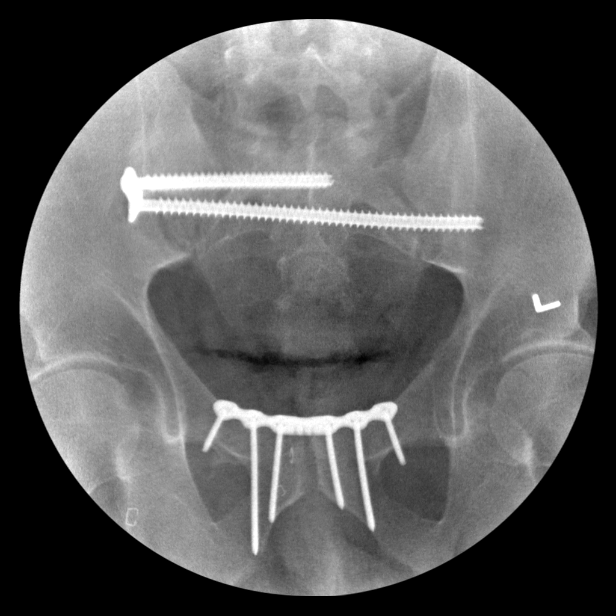
[im 14/16]
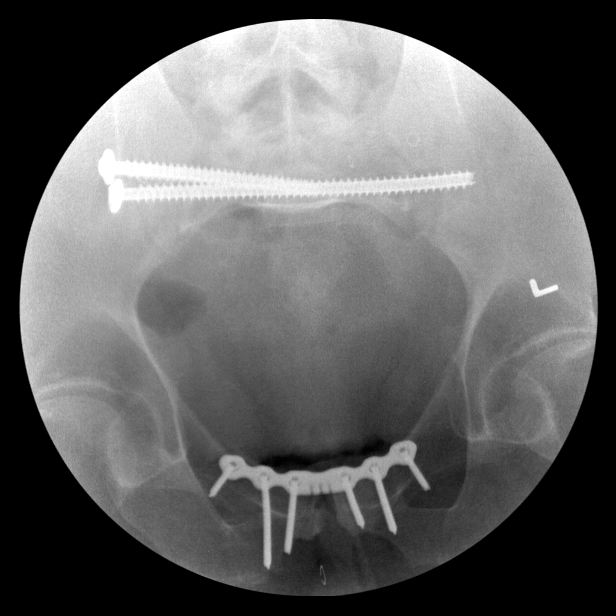
[im 15/16]
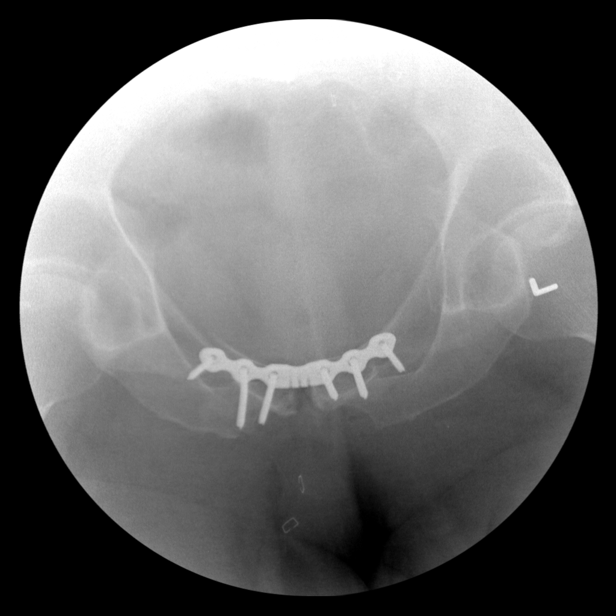
[im 16/16]
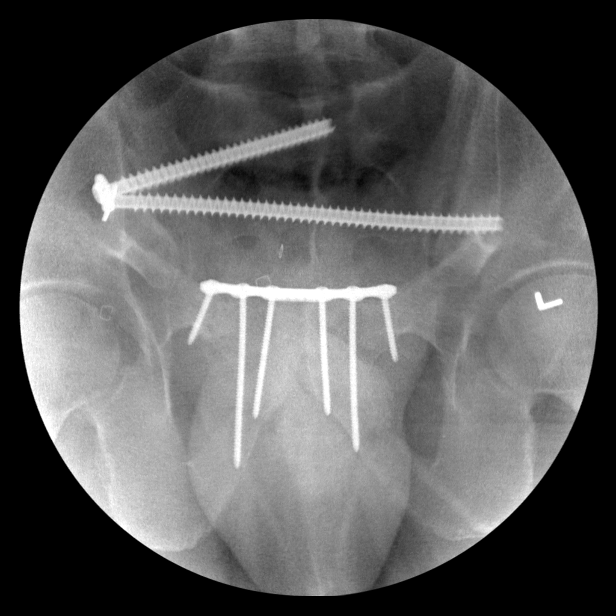

[15 of 16 positions shown; findings below may reference images not displayed]

FINDINGS: Sacroiliac and pubic ORIF.  Hardware intact.  Anatomic alignment.
IMPRESSION: Sacroiliac and pubic ORIF.  Hardware intact.  Anatomic alignment.

## 2020-07-25 IMAGING — RF LEFT WRIST - COMPLETE 3+ VIEW
1 series · 6 of 6 positions shown · non-contrast
Comparison: None.

CLINICAL DATA: ORIF of left wrist fracture

EXAM:
LEFT WRIST - COMPLETE 3+ VIEW

[Series 1: run · 6 of 6 slices shown]
[im 1/6]
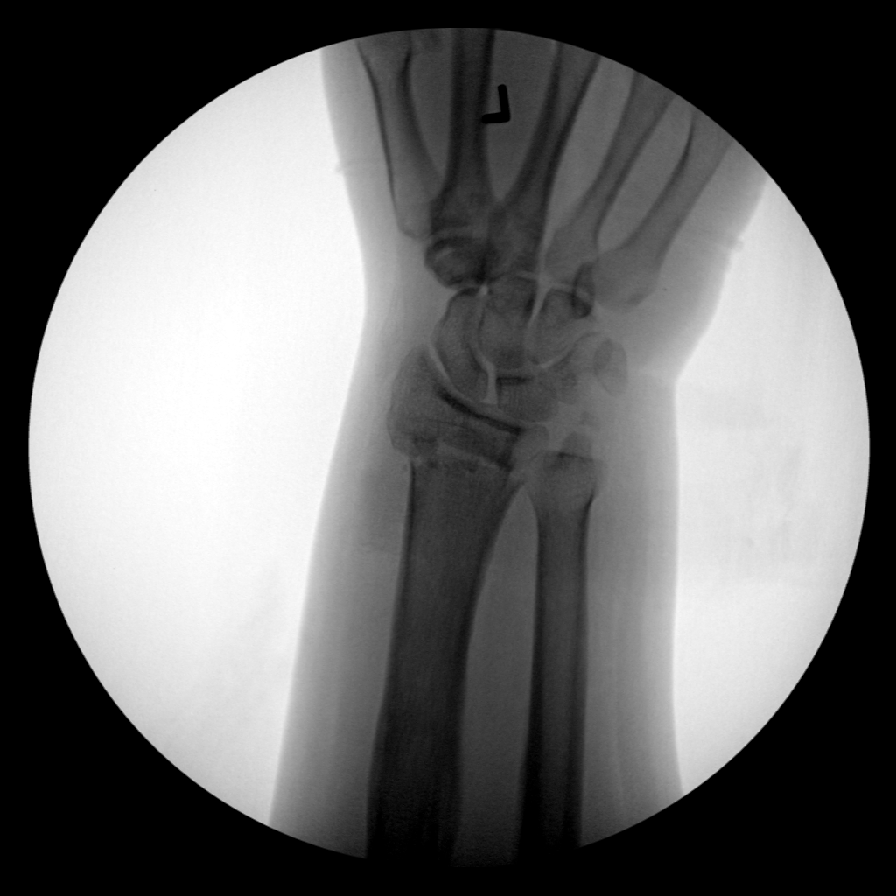
[im 2/6]
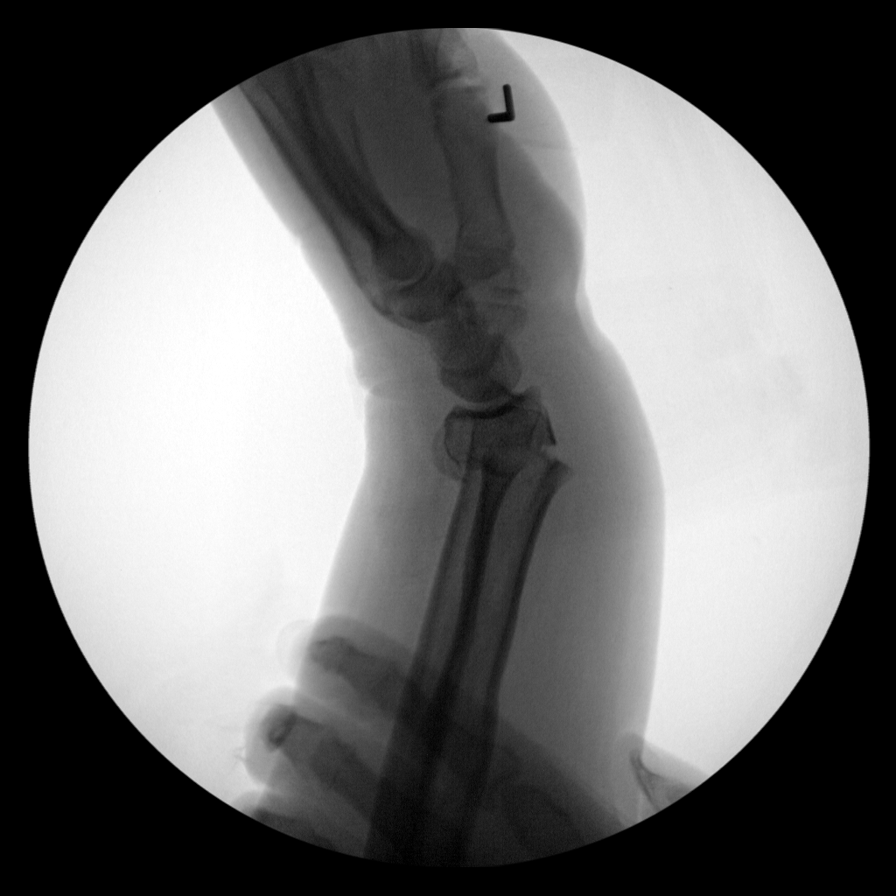
[im 3/6]
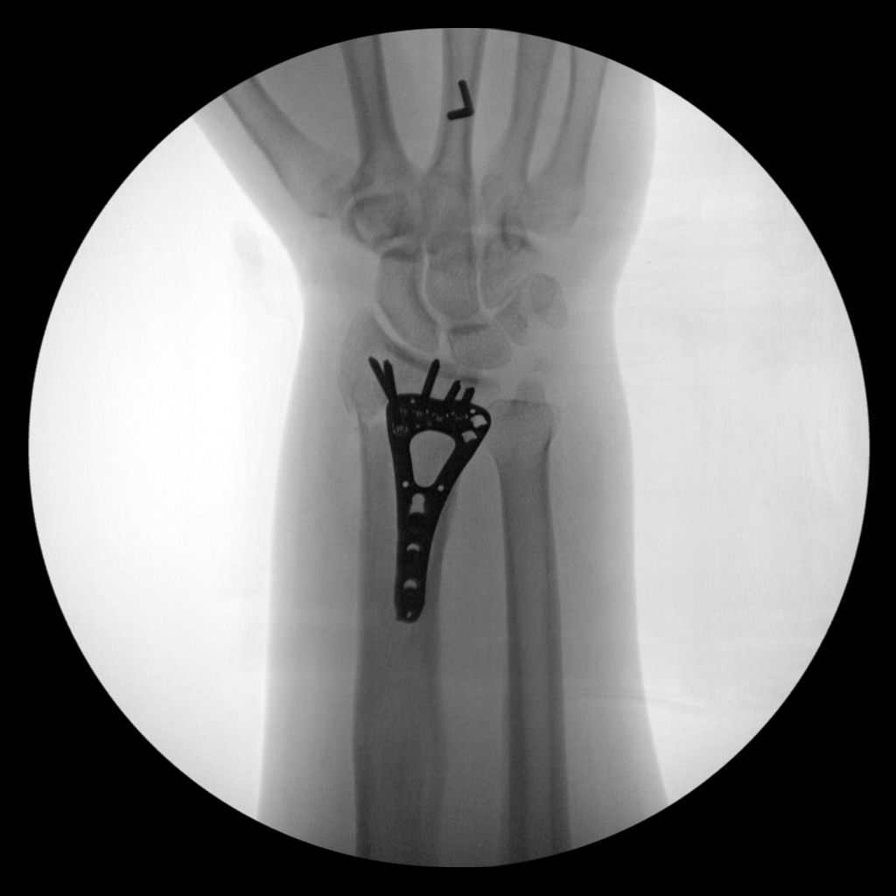
[im 4/6]
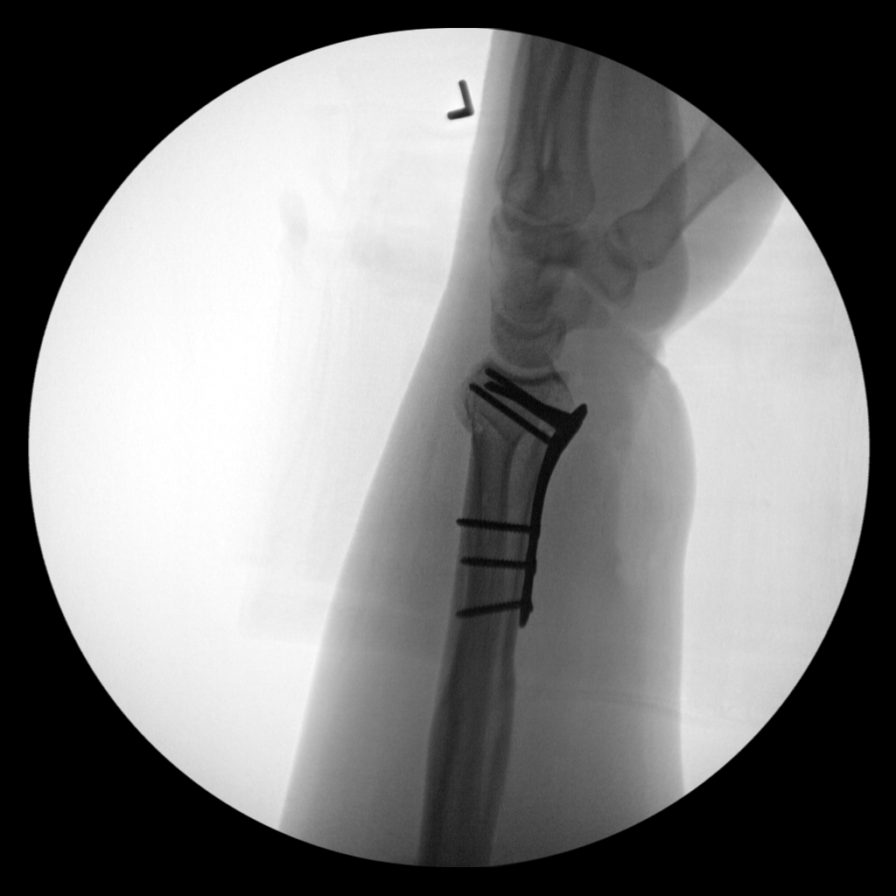
[im 5/6]
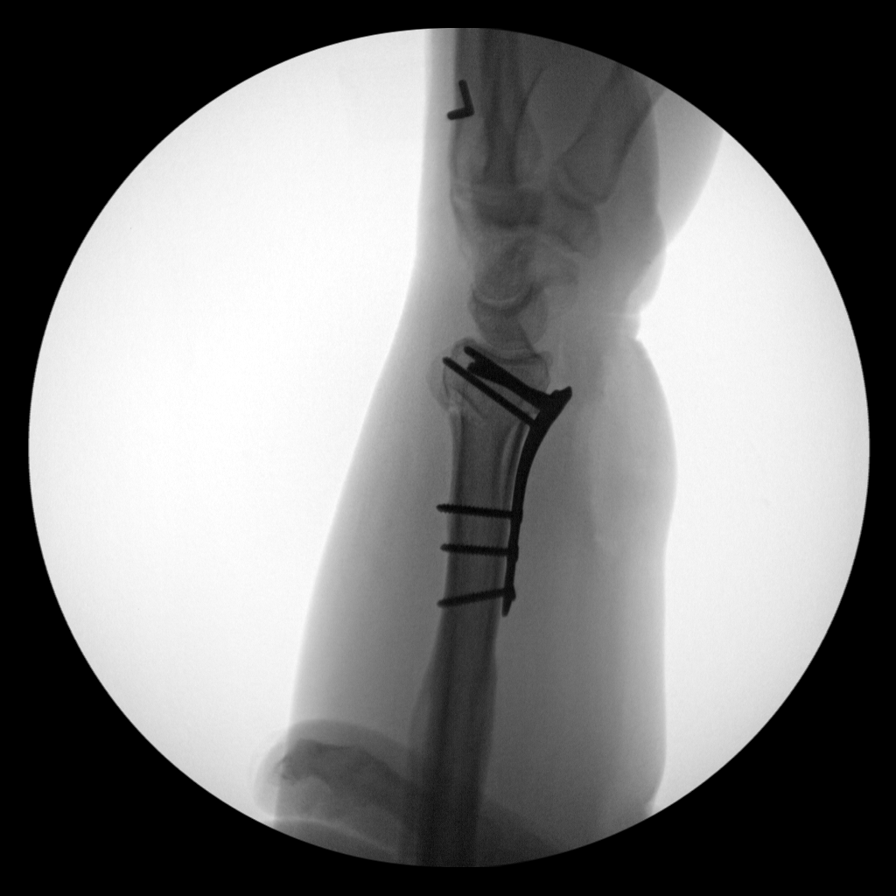
[im 6/6]
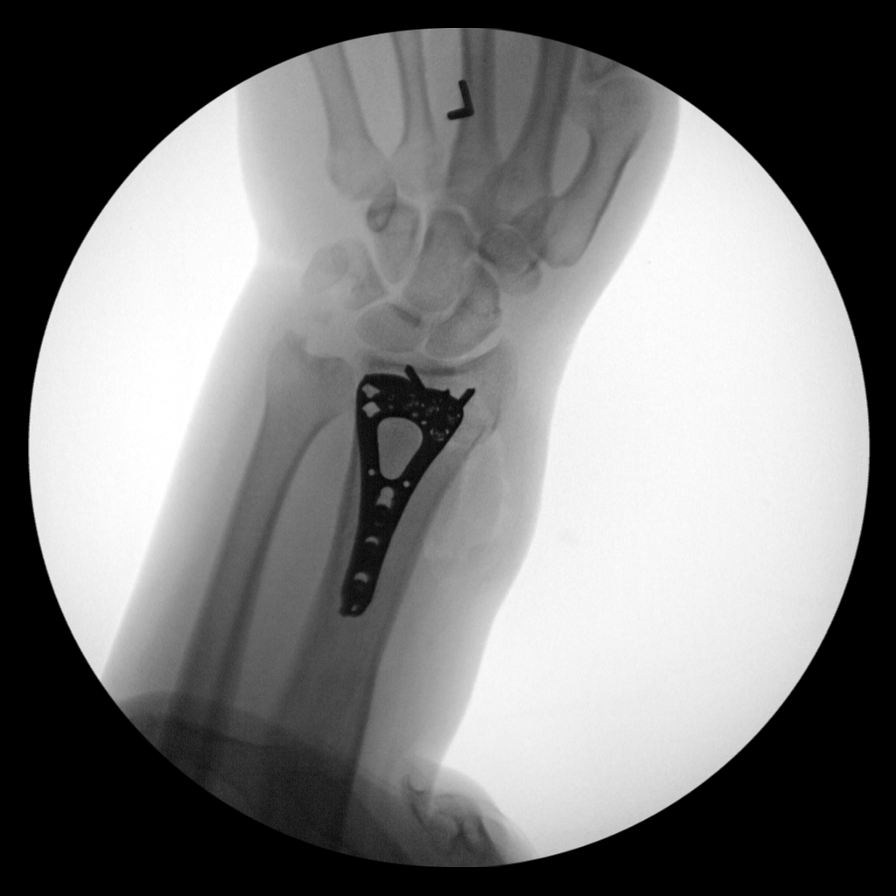

[6 of 6 positions shown; findings below may reference images not displayed]

FINDINGS: Intraoperative fluoroscopic images demonstrating plate and screw
fixation of the distal LEFT radius fracture. Final images show
significantly improved osseous alignment at the fracture site. Plate
and screw fixation hardware appears intact and appropriately
positioned.

Fluoroscopy was provided for 6 minutes and 47 seconds.
IMPRESSION: 1. Intraoperative fluoroscopic images demonstrating plate and screw
fixation of the distal LEFT radius fracture. No evidence of surgical
complicating feature.
2. Incidental note made of a possible nondisplaced fracture of the
scaphoid bone, mid to upper pole region.

These results will be called to the ordering clinician or
representative by the Radiologist Assistant, and communication
documented in the PACS or zVision Dashboard.

## 2020-07-25 IMAGING — DX PORTABLE PELVIS 1-2 VIEWS
1 series · 2 of 2 positions shown · non-contrast
Comparison: CT from previous day

CLINICAL DATA: Post operative for pelvic fracture. Post operative L
forearm

EXAM:
PORTABLE PELVIS 1-2 VIEWS

[Series 1: pelvis · 0.14mm/px · 2 of 2 slices shown]
[im 1/2]
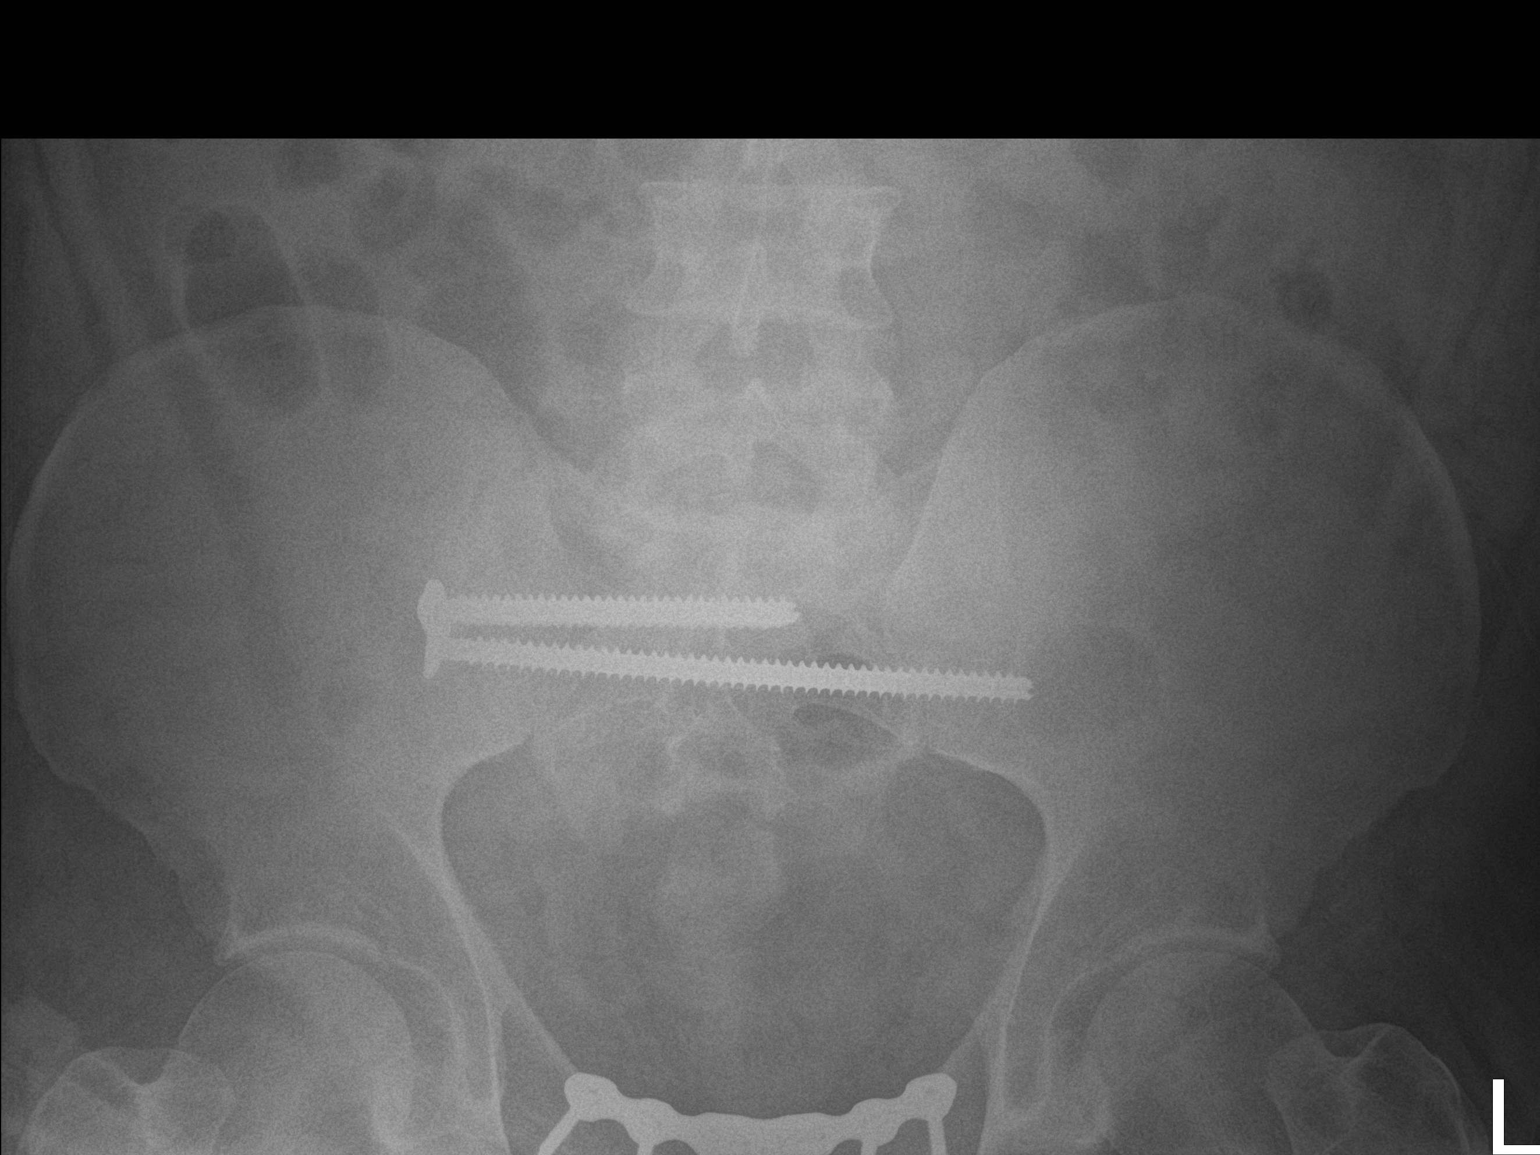
[im 2/2]
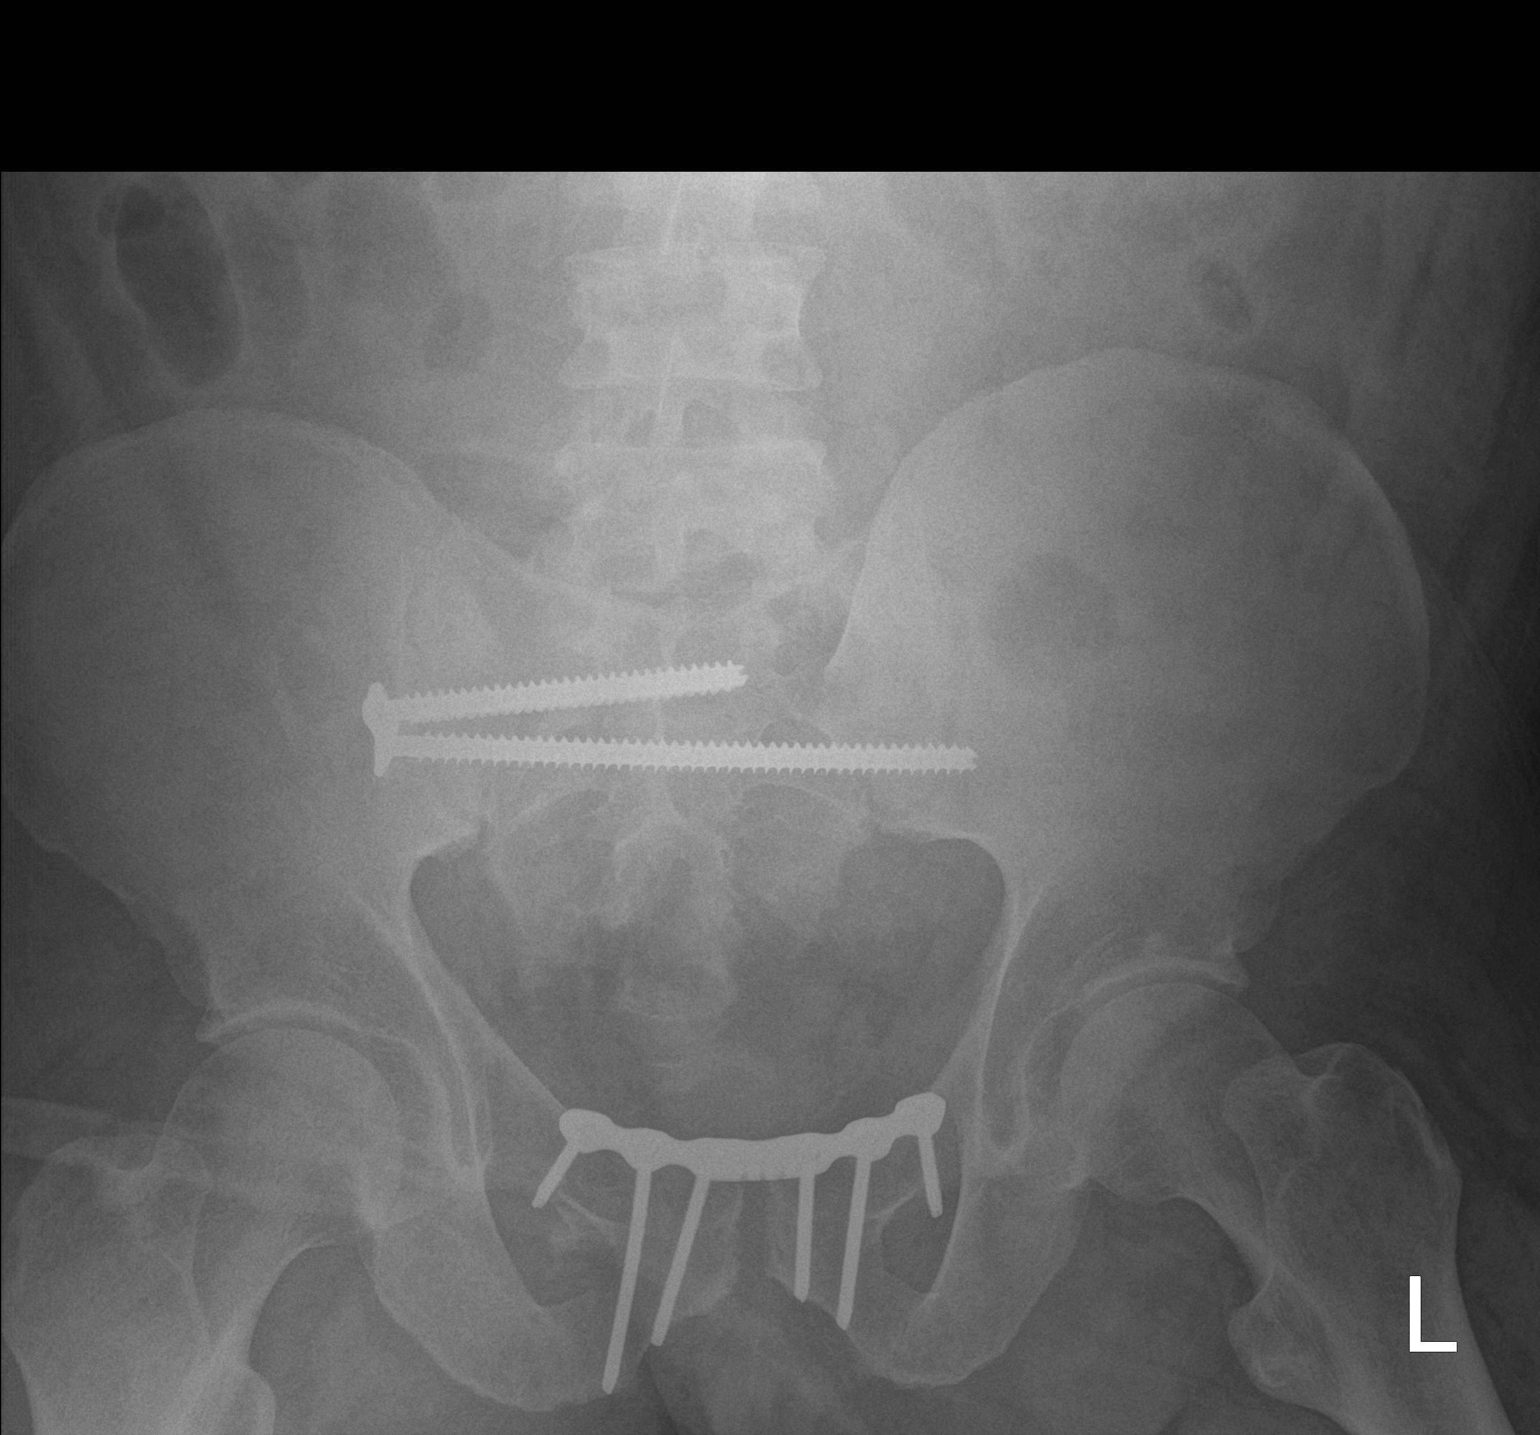

[2 of 2 positions shown; findings below may reference images not displayed]

FINDINGS: 2 screws across the right sacroiliac joint, 1 extending across the
left SI joint. Fixation hardware across pubic symphysis and right
superior pubic ramus fracture. Fracture fragments in near anatomic
alignment.
IMPRESSION: Internal fixation of pelvic fractures as above.

## 2021-07-24 ENCOUNTER — Encounter: Payer: Self-pay | Admitting: Emergency Medicine

## 2021-07-24 ENCOUNTER — Emergency Department: Payer: No Typology Code available for payment source

## 2021-07-24 ENCOUNTER — Other Ambulatory Visit: Payer: Self-pay

## 2021-07-24 ENCOUNTER — Emergency Department
Admission: EM | Admit: 2021-07-24 | Discharge: 2021-07-24 | Discharge status: Against Medical Advice | Payer: No Typology Code available for payment source | Attending: Emergency Medicine | Admitting: Emergency Medicine

## 2021-07-24 DIAGNOSIS — J449 Chronic obstructive pulmonary disease, unspecified: Secondary | ICD-10-CM | POA: Insufficient documentation

## 2021-07-24 DIAGNOSIS — F17200 Nicotine dependence, unspecified, uncomplicated: Secondary | ICD-10-CM | POA: Diagnosis not present

## 2021-07-24 DIAGNOSIS — Z5321 Procedure and treatment not carried out due to patient leaving prior to being seen by health care provider: Secondary | ICD-10-CM | POA: Insufficient documentation

## 2021-07-24 DIAGNOSIS — R0602 Shortness of breath: Secondary | ICD-10-CM | POA: Diagnosis not present

## 2021-07-24 DIAGNOSIS — R079 Chest pain, unspecified: Secondary | ICD-10-CM | POA: Diagnosis present

## 2021-07-24 LAB — BASIC METABOLIC PANEL
Anion gap: 9 (ref 5–15)
BUN: 13 mg/dL (ref 6–20)
CO2: 25 mmol/L (ref 22–32)
Calcium: 9.3 mg/dL (ref 8.9–10.3)
Chloride: 100 mmol/L (ref 98–111)
Creatinine, Ser: 0.95 mg/dL (ref 0.61–1.24)
GFR, Estimated: >60 mL/min (ref >60)
Glucose, Bld: 94 mg/dL (ref 70–99)
Potassium: 3.8 mmol/L (ref 3.5–5.1)
Sodium: 134 mmol/L — ABNORMAL LOW (ref 135–145)

## 2021-07-24 LAB — CBC
HCT: 48.1 % (ref 39.0–52.0)
Hemoglobin: 16.3 g/dL (ref 13.0–17.0)
MCH: 29.6 pg (ref 26.0–34.0)
MCHC: 33.9 g/dL (ref 30.0–36.0)
MCV: 87.3 fL (ref 80.0–100.0)
Platelets: 265 10*3/uL (ref 150–400)
RBC: 5.51 MIL/uL (ref 4.22–5.81)
RDW: 12.6 % (ref 11.5–15.5)
WBC: 12.8 10*3/uL — ABNORMAL HIGH (ref 4.0–10.5)
nRBC: 0 % (ref 0.0–0.2)

## 2021-07-24 LAB — TROPONIN I (HIGH SENSITIVITY): Troponin I (High Sensitivity): 10 ng/L (ref <18)

## 2021-07-24 NOTE — ED Provider Notes (Signed)
Emergency Medicine Provider Triage Evaluation Note ? ?Richard Ferguson , a 46 y.o. male  was evaluated in triage.  Patient has history of COPD, hypertension and nicotine dependence presenting to the emergency department with nonradiating chest pain and shortness of breath that started yesterday.  Patient states that he was last admitted for a COPD exacerbation 1 year ago.  Denies lower extremity swelling, nausea or vomiting. ?Review of Systems  ?Positive: Patient has chest pain and SOB.  ?Negative: No nausea, vomiting or abdominal pain. ? ?Physical Exam  ?There were no vitals taken for this visit. ?Gen:   Awake, no distress   ?Resp:  Normal effort  ?MSK:   Moves extremities without difficulty  ?Other:   ? ?Medical Decision Making  ?Medically screening exam initiated at 5:29 PM.  Appropriate orders placed.  Richard Ferguson was informed that the remainder of the evaluation will be completed by another provider, this initial triage assessment does not replace that evaluation, and the importance of remaining in the ED until their evaluation is complete. ? ? ?  ?Pia Mau [place], PA-C ?07/24/21 1731 ? ?  ?Phineas Semen, MD ?07/24/21 1745 ? ?

## 2021-07-24 NOTE — ED Notes (Signed)
Calling pt for repeat vitals and troponin, no answer in lobby, tech in lobby looking for pt.  ?

## 2021-07-24 NOTE — ED Triage Notes (Signed)
Pt in via POV, reports onset chest pain yesterday, worsening today with some associated shortness of breath.  Reports hx of COPD, but has never had this kind of pain.  Appears uncomfortable in triage.   ?

## 2021-07-24 NOTE — ED Notes (Signed)
Pt not visible in lobby, no answer when called, pt not outside immediate ed doors.  ?

## 2021-07-24 NOTE — ED Notes (Signed)
No answer again when called, pt not visualized in lobby ?

## 2021-09-14 ENCOUNTER — Ambulatory Visit (INDEPENDENT_AMBULATORY_CARE_PROVIDER_SITE_OTHER): Payer: Self-pay

## 2021-09-14 ENCOUNTER — Ambulatory Visit
Admission: EM | Admit: 2021-09-14 | Discharge: 2021-09-14 | Disposition: A | Discharge status: Home / Self Care | Payer: Self-pay | Attending: Physician Assistant | Admitting: Physician Assistant

## 2021-09-14 DIAGNOSIS — Z79899 Other long term (current) drug therapy: Secondary | ICD-10-CM | POA: Insufficient documentation

## 2021-09-14 DIAGNOSIS — Z7951 Long term (current) use of inhaled steroids: Secondary | ICD-10-CM | POA: Insufficient documentation

## 2021-09-14 DIAGNOSIS — J441 Chronic obstructive pulmonary disease with (acute) exacerbation: Secondary | ICD-10-CM

## 2021-09-14 DIAGNOSIS — Z720 Tobacco use: Secondary | ICD-10-CM | POA: Insufficient documentation

## 2021-09-14 DIAGNOSIS — R0789 Other chest pain: Secondary | ICD-10-CM | POA: Insufficient documentation

## 2021-09-14 DIAGNOSIS — R55 Syncope and collapse: Secondary | ICD-10-CM | POA: Insufficient documentation

## 2021-09-14 DIAGNOSIS — I10 Essential (primary) hypertension: Secondary | ICD-10-CM | POA: Insufficient documentation

## 2021-09-14 DIAGNOSIS — K219 Gastro-esophageal reflux disease without esophagitis: Secondary | ICD-10-CM | POA: Insufficient documentation

## 2021-09-14 DIAGNOSIS — Z20822 Contact with and (suspected) exposure to covid-19: Secondary | ICD-10-CM | POA: Insufficient documentation

## 2021-09-14 DIAGNOSIS — R051 Acute cough: Secondary | ICD-10-CM | POA: Insufficient documentation

## 2021-09-14 LAB — RESP PANEL BY RT-PCR (FLU A&B, COVID) ARPGX2
Influenza A by PCR: NEGATIVE
Influenza B by PCR: NEGATIVE
SARS Coronavirus 2 by RT PCR: NEGATIVE

## 2021-09-14 MED ORDER — PROMETHAZINE-DM 6.25-15 MG/5ML PO SYRP: 5.0000 mL | ORAL_SOLUTION | Freq: Four times a day (QID) | ORAL | 0 refills | Status: DC | PRN

## 2021-09-14 MED ORDER — PANTOPRAZOLE SODIUM 40 MG PO TBEC: 40.0000 mg | DELAYED_RELEASE_TABLET | Freq: Every day | ORAL | 0 refills | Status: DC

## 2021-09-14 MED ORDER — ALUM & MAG HYDROXIDE-SIMETH 200-200-20 MG/5ML PO SUSP
30.0000 mL | Freq: Once | ORAL | Status: AC
  Administered 2021-09-14: 30 mL via ORAL

## 2021-09-14 MED ORDER — SUCRALFATE 1 GM/10ML PO SUSP: 1.0000 g | Freq: Three times a day (TID) | ORAL | 0 refills | Status: DC

## 2021-09-14 MED ORDER — LIDOCAINE VISCOUS HCL 2 % MT SOLN
15.0000 mL | Freq: Once | OROMUCOSAL | Status: AC
  Administered 2021-09-14: 15 mL via ORAL

## 2021-09-14 MED ORDER — IPRATROPIUM-ALBUTEROL 0.5-2.5 (3) MG/3ML IN SOLN
3.0000 mL | Freq: Once | RESPIRATORY_TRACT | Status: AC
  Administered 2021-09-14: 3 mL via RESPIRATORY_TRACT

## 2021-09-14 MED ORDER — AZITHROMYCIN 250 MG PO TABS: 250.0000 mg | ORAL_TABLET | Freq: Every day | ORAL | 0 refills | Status: DC

## 2021-09-14 MED ORDER — PREDNISONE 20 MG PO TABS: 40.0000 mg | ORAL_TABLET | Freq: Every day | ORAL | 0 refills | Status: AC

## 2021-09-14 NOTE — ED Triage Notes (Addendum)
Pt states he has COPD and started on Friday with shortness of breath, and then developed a cough. Coughed so hard this a.m he "blacked out." Has been using his inhalers but doesn't seem to be helping. Burning sensation in chest. ?
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
## 2021-09-14 NOTE — Discharge Instructions (Addendum)
-  Your x-ray was normal.  Your COVID and flu test were negative. ?- Since the breathing treatment and GI cocktail helped, I think your symptoms are a combination of flareup of your COPD and your reflux.  I have sent prednisone and an antibiotic for COPD exacerbation and 2 different medications for your acid reflux.  Increase rest and fluids and avoid trigger foods. ?- Your syncopal episodes were likely secondary to a vasovagal reaction caused by your coughing.  Try to avoid coughing so forcefully.  I have also sent a cough medicine for you. ?- Follow-up with your doctor.  Go to emergency department for any worsening of symptoms especially if your shortness of breath worsens or the chest pain gets worse or starts to radiate to your extremities or neck. ?

## 2021-10-02 ENCOUNTER — Encounter: Payer: Self-pay | Admitting: Emergency Medicine

## 2021-10-02 ENCOUNTER — Other Ambulatory Visit: Payer: Self-pay

## 2021-10-02 ENCOUNTER — Ambulatory Visit
Admission: EM | Admit: 2021-10-02 | Discharge: 2021-10-02 | Disposition: A | Discharge status: Home / Self Care | Payer: Self-pay | Attending: Physician Assistant | Admitting: Physician Assistant

## 2021-10-02 DIAGNOSIS — K219 Gastro-esophageal reflux disease without esophagitis: Secondary | ICD-10-CM

## 2021-10-02 DIAGNOSIS — G479 Sleep disorder, unspecified: Secondary | ICD-10-CM

## 2021-10-02 DIAGNOSIS — J441 Chronic obstructive pulmonary disease with (acute) exacerbation: Secondary | ICD-10-CM

## 2021-10-02 DIAGNOSIS — R051 Acute cough: Secondary | ICD-10-CM

## 2021-10-02 MED ORDER — PROMETHAZINE-DM 6.25-15 MG/5ML PO SYRP: 5.0000 mL | ORAL_SOLUTION | Freq: Three times a day (TID) | ORAL | 0 refills | Status: DC | PRN

## 2021-10-02 MED ORDER — PREDNISONE 10 MG PO TABS: ORAL_TABLET | ORAL | 0 refills | Status: DC

## 2021-10-02 MED ORDER — PANTOPRAZOLE SODIUM 40 MG PO TBEC: 40.0000 mg | DELAYED_RELEASE_TABLET | Freq: Every day | ORAL | 3 refills | Status: AC

## 2021-10-02 MED ORDER — HYDROCOD POLI-CHLORPHE POLI ER 10-8 MG/5ML PO SUER: 5.0000 mL | Freq: Every evening | ORAL | 0 refills | Status: DC | PRN

## 2021-10-02 MED ORDER — AMOXICILLIN-POT CLAVULANATE 875-125 MG PO TABS: 1.0000 | ORAL_TABLET | Freq: Two times a day (BID) | ORAL | 0 refills | Status: AC

## 2021-10-02 MED ORDER — IPRATROPIUM BROMIDE 0.06 % NA SOLN: 2.0000 | Freq: Four times a day (QID) | NASAL | 0 refills | Status: DC
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
## 2021-10-02 NOTE — ED Triage Notes (Signed)
Patient states that he was seen couple of weeks ago for COPD flare up and states that after he took his medications he started to feel better.  Patient states that over the past 3 days he has had cough, chest congestion and SOB that has started back.  Patient denies fevers.  ?

## 2021-10-02 NOTE — Discharge Instructions (Signed)
-  COPD exacerbation ?- I sent Augmentin to the pharmacy which is a different antibiotic as well as more prednisone for a longer course ?- Start using Atrovent nasal spray. ?- Take Promethazine DM during day for cough and Tussionex at bedtime if needed for cough keeping you awake at night. ?- Increase rest and fluids.  Use your inhalers and nebulizers as well for any shortness of breath. ?- I sent more Protonix to pharmacy for your GERD.  Follow-up with PCP if you need more refills. ?- Follow-up here if fever, worsening symptoms including increased breathing difficulty or if you are not feeling better in the next several days. ?

## 2021-10-26 ENCOUNTER — Ambulatory Visit
Admission: EM | Admit: 2021-10-26 | Discharge: 2021-10-26 | Disposition: A | Discharge status: Home / Self Care | Payer: Self-pay | Attending: Physician Assistant | Admitting: Physician Assistant

## 2021-10-26 DIAGNOSIS — R051 Acute cough: Secondary | ICD-10-CM

## 2021-10-26 DIAGNOSIS — J449 Chronic obstructive pulmonary disease, unspecified: Secondary | ICD-10-CM

## 2021-10-26 DIAGNOSIS — J019 Acute sinusitis, unspecified: Secondary | ICD-10-CM

## 2021-10-26 MED ORDER — PSEUDOEPH-BROMPHEN-DM 30-2-10 MG/5ML PO SYRP: 10.0000 mL | ORAL_SOLUTION | Freq: Four times a day (QID) | ORAL | 0 refills | Status: AC | PRN

## 2021-10-26 MED ORDER — PREDNISONE 20 MG PO TABS: 40.0000 mg | ORAL_TABLET | Freq: Every day | ORAL | 0 refills | Status: AC

## 2021-10-26 MED ORDER — DOXYCYCLINE HYCLATE 100 MG PO CAPS: 100.0000 mg | ORAL_CAPSULE | Freq: Two times a day (BID) | ORAL | 0 refills | Status: AC

## 2021-10-26 NOTE — Discharge Instructions (Addendum)
-  I have sent a cough medicine to the pharmacy.  Use good Rx to pay for this medication it should be under $10.  Ask the pharmacy staff about good Rx if you do not have the app on your phone. - Have also sent prednisone and an antibiotic. - Consider using Flonase every day. - Increase rest and fluids and keep your follow-up appointment with your doctor in a couple months.  You should really consider a referral to a pulmonologist since you have had multiple flareups of your COPD.  Continue to use your inhalers. - Go to ER for any severe acute worsening of any of your symptoms.
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
## 2021-10-26 NOTE — ED Triage Notes (Signed)
Patient presents to UC for Allergies, coughing, sinus pressure -- started about a week ago.  Patient has COPD.   Patient states he has been taking OTC allergy medication -- has not been helping.

## 2022-07-22 ENCOUNTER — Ambulatory Visit: Payer: Self-pay

## 2022-07-22 ENCOUNTER — Ambulatory Visit (INDEPENDENT_AMBULATORY_CARE_PROVIDER_SITE_OTHER): Payer: Self-pay

## 2022-07-22 ENCOUNTER — Ambulatory Visit
Admission: EM | Admit: 2022-07-22 | Discharge: 2022-07-22 | Disposition: A | Discharge status: Home / Self Care | Payer: Self-pay

## 2022-07-22 DIAGNOSIS — R197 Diarrhea, unspecified: Secondary | ICD-10-CM

## 2022-07-22 DIAGNOSIS — R110 Nausea: Secondary | ICD-10-CM

## 2022-07-22 LAB — CBC WITH DIFFERENTIAL/PLATELET
Abs Immature Granulocytes: 0.04 10*3/uL (ref 0.00–0.07)
Basophils Absolute: 0.1 10*3/uL (ref 0.0–0.1)
Basophils Relative: 1 %
Eosinophils Absolute: 0.2 10*3/uL (ref 0.0–0.5)
Eosinophils Relative: 2 %
HCT: 44.8 % (ref 39.0–52.0)
Hemoglobin: 15 g/dL (ref 13.0–17.0)
Immature Granulocytes: 0 %
Lymphocytes Relative: 23 %
Lymphs Abs: 2.3 10*3/uL (ref 0.7–4.0)
MCH: 29.8 pg (ref 26.0–34.0)
MCHC: 33.5 g/dL (ref 30.0–36.0)
MCV: 88.9 fL (ref 80.0–100.0)
Monocytes Absolute: 0.8 10*3/uL (ref 0.1–1.0)
Monocytes Relative: 8 %
Neutro Abs: 6.8 10*3/uL (ref 1.7–7.7)
Neutrophils Relative %: 66 %
Platelets: 220 10*3/uL (ref 150–400)
RBC: 5.04 MIL/uL (ref 4.22–5.81)
RDW: 13.2 % (ref 11.5–15.5)
WBC: 10.2 10*3/uL (ref 4.0–10.5)
nRBC: 0 % (ref 0.0–0.2)

## 2022-07-22 LAB — COMPREHENSIVE METABOLIC PANEL
ALT: 39 U/L (ref 0–44)
AST: 35 U/L (ref 15–41)
Albumin: 4.1 g/dL (ref 3.5–5.0)
Alkaline Phosphatase: 95 U/L (ref 38–126)
Anion gap: 7 (ref 5–15)
BUN: 18 mg/dL (ref 6–20)
CO2: 26 mmol/L (ref 22–32)
Calcium: 7.8 mg/dL — ABNORMAL LOW (ref 8.9–10.3)
Chloride: 103 mmol/L (ref 98–111)
Creatinine, Ser: 0.78 mg/dL (ref 0.61–1.24)
GFR, Estimated: >60 mL/min (ref >60)
Glucose, Bld: 107 mg/dL — ABNORMAL HIGH (ref 70–99)
Potassium: 4 mmol/L (ref 3.5–5.1)
Sodium: 136 mmol/L (ref 135–145)
Total Bilirubin: 0.4 mg/dL (ref 0.3–1.2)
Total Protein: 7.5 g/dL (ref 6.5–8.1)

## 2022-07-22 MED ORDER — ONDANSETRON 8 MG PO TBDP: 8.0000 mg | ORAL_TABLET | Freq: Three times a day (TID) | ORAL | 0 refills | Status: DC | PRN

## 2022-07-22 NOTE — ED Triage Notes (Signed)
Pt presents to UC c/o diarrhea, nausea x3 weeks, pt states he has moments when he has felt better. Pt states diarrhea has lightened up some. Denies any blood in stools

## 2022-07-22 NOTE — Discharge Instructions (Addendum)
Your blood work did not show any signs of infection or electrolyte abnormality.  Your x-ray did not show any signs of bowel obstruction or fecal loading (constipation).  I am going to give you a prescription for Zofran that you can take every 8 hours as needed for nausea.  Continue to drink clear fluids and also try adding some bland foods to your diet to see if this improves your diarrhea.  I have made a referral to [facility] to further evaluate your symptoms.  They will call you to make an appointment.  If you develop any vomiting and are unable to keep down fluids, abdominal pain, increasing diarrhea, or develop blood in your stool please go to the ER for evaluation.
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
## 2022-07-26 ENCOUNTER — Other Ambulatory Visit: Payer: Self-pay

## 2022-07-27 ENCOUNTER — Encounter: Payer: Self-pay | Admitting: Gastroenterology

## 2022-07-27 ENCOUNTER — Ambulatory Visit: Payer: Self-pay | Admitting: Gastroenterology

## 2022-07-27 ENCOUNTER — Other Ambulatory Visit: Payer: Self-pay

## 2022-07-27 VITALS — BP 143/85 | HR 74 | Temp 97.8°F | Ht 71.0 in | Wt 309.5 lb

## 2022-07-27 DIAGNOSIS — K529 Noninfective gastroenteritis and colitis, unspecified: Secondary | ICD-10-CM

## 2022-07-27 DIAGNOSIS — R1013 Epigastric pain: Secondary | ICD-10-CM

## 2022-07-27 DIAGNOSIS — R140 Abdominal distension (gaseous): Secondary | ICD-10-CM

## 2022-07-27 DIAGNOSIS — Z1211 Encounter for screening for malignant neoplasm of colon: Secondary | ICD-10-CM

## 2022-07-27 NOTE — Progress Notes (Signed)
Cephas Darby, MD [address]  [place]  [place], [place] [address]  Main: [phone]  Fax: [phone]    Gastroenterology Consultation  Referring Provider:     Margarette Canada, NP Primary Care Physician:  Patient, No Pcp Per Primary Gastroenterologist:  Dr. Cephas Darby Reason for Consultation: Acute diarrhea, abdominal bloating, epigastric pain        HPI:   IZEKIEL GERARDOT is a 47 y.o. male referred by ER for consultation & management of approximately 3 weeks history of watery diarrhea associated with severe abdominal bloating, heartburn and epigastric discomfort.  Reports that his abdominal bloating is most worrisome today.  He went to ER on 2/29 with the symptoms and he lost about 20 pounds within last 3 weeks because of decreased p.o. intake and diet modification.  Patient consumes red meat almost on a daily basis and carbonated beverages.  Since onset of his symptoms, he has significantly cut back on red meat and eating more lean meat along with vegetables.  He does smoke cigarettes, 1 pack/day, cut down from 2 packs/day about an year ago, has been smoking for 30 years.  He used to drink alcohol, cut back on it significantly, currently a social drink.  Patient is accompanied by his wife today.  Labs in the ER revealed normal CBC and CMP.  He was discharged on Zofran with GI follow-up.  Patient has been taking 6-7 times daily for heartburn.  He is also taking probiotics.  He thinks his diarrhea has improved, currently having 2-3 soft mushy bowel movements.  Bloating is worse after a greasy meal Patient admits to eating out on a regular basis.  He reports having symptoms of reflux, epigastric pain, bloating on and off for several years.  This has been worse.  Generally, when he has any gastroenteritis, he has prolonged recovery from his symptoms  He denies any family history of GI malignancy, IBD  NSAIDs: None  Antiplts/Anticoagulants/Anti thrombotics: None  GI  Procedures: Reports undergoing upper endoscopy and colonoscopy more than 10 years ago  Past Medical History:  Diagnosis Date   Back injuries    Closed fracture of capitate bone of right wrist 12/21/2018   Closed fracture of left distal radius 12/21/2018   Closed fracture of right distal radius and ulna, initial encounter 12/21/2018   Closed fracture of triquetrum of right wrist 12/21/2018   Closed transverse fracture of waist of scaphoid, right, initial encounter 12/21/2018   COPD (chronic obstructive pulmonary disease) ([place])    Hypertension    Laceration of left leg 12/21/2018   Multiple trauma 12/23/2018   Open nondisp fracture of base of third metacarpal bone of right hand 12/21/2018   Open nondisplaced fracture of base of fourth metacarpal bone of right hand 12/21/2018   Pelvic fracture ([place]) 12/17/2018    Past Surgical History:  Procedure Laterality Date   CARPAL TUNNEL RELEASE     OPEN REDUCTION INTERNAL FIXATION (ORIF) DISTAL RADIAL FRACTURE Bilateral 12/18/2018   Procedure: OPEN REDUCTION INTERNAL FIXATION (ORIF) DISTAL RADIAL FRACTURE;  Surgeon: Shona Needles, MD;  Location: [facility];  Service: Orthopedics;  Laterality: Bilateral;  laceration repair to left leg   ORIF PELVIC FRACTURE N/A 12/18/2018   Procedure: OPEN REDUCTION INTERNAL FIXATION (ORIF) PELVIC FRACTURE;  Surgeon: Shona Needles, MD;  Location: [facility];  Service: Orthopedics;  Laterality: N/A;     Current Outpatient Medications:    acetaminophen (TYLENOL) 500 MG tablet, Take 1-2 tablets (500-1,000 mg total)  by mouth 4 (four) times daily -  with meals and at bedtime., Disp: 30 tablet, Rfl: 0   albuterol (VENTOLIN HFA) 108 (90 Base) MCG/ACT inhaler, Inhale into the lungs., Disp: , Rfl:    amLODipine (NORVASC) 10 MG tablet, Take 1 tablet by mouth daily., Disp: , Rfl:    FLUoxetine (PROZAC) 20 MG capsule, Take by mouth., Disp: , Rfl:    gabapentin (NEURONTIN) 400 MG capsule, Take 1 capsule (400 mg total) by mouth 3  (three) times daily. (Patient taking differently: Take 100 mg by mouth at bedtime.), Disp: 90 capsule, Rfl: 1   hydrochlorothiazide (HYDRODIURIL) 25 MG tablet, , Disp: , Rfl:    losartan (COZAAR) 100 MG tablet, Take by mouth., Disp: , Rfl:    ondansetron (ZOFRAN-ODT) 8 MG disintegrating tablet, Take 1 tablet (8 mg total) by mouth every 8 (eight) hours as needed for nausea or vomiting., Disp: 20 tablet, Rfl: 0   pantoprazole (PROTONIX) 40 MG tablet, Take 1 tablet (40 mg total) by mouth daily., Disp: 30 tablet, Rfl: 3   sildenafil (REVATIO) 20 MG tablet, Take 1-5 45 minutes before sexual activity, Disp: , Rfl:    WIXELA INHUB 250-50 MCG/ACT AEPB, Inhale into the lungs., Disp: , Rfl:    Family History  Problem Relation Age of Onset   Hypertension Mother    Hypertension Father    Renal Disease Father    Hypertension Sister    Hypertension Brother      Social History   Tobacco Use   Smoking status: Every Day    Packs/day: 2.00    Types: Cigarettes   Smokeless tobacco: Never  Vaping Use   Vaping Use: Never used  Substance Use Topics   Alcohol use: Yes    Comment: Social   Drug use: No    Allergies as of 07/27/2022 - Review Complete 07/27/2022  Allergen Reaction Noted   Naproxen sodium Hives 10/23/2012    Review of Systems:    All systems reviewed and negative except where noted in HPI.   Physical Exam:  BP (!) 143/85 (BP Location: Left Arm, Patient Position: Sitting, Cuff Size: Large)   Pulse 74   Temp 97.8 F (36.6 C) (Oral)   Ht '5\' 11"'$  (1.803 m)   Wt (!) 309 lb 8 oz (140.4 kg)   BMI 43.17 kg/m  No LMP for male patient.  General:   Alert,  Well-developed, well-nourished, pleasant and cooperative in NAD Head:  Normocephalic and atraumatic. Eyes:  Sclera clear, no icterus.   Conjunctiva pink. Ears:  Normal auditory acuity. Nose:  No deformity, discharge, or lesions. Mouth:  No deformity or lesions,oropharynx pink & moist. Neck:  Supple; no masses or  thyromegaly. Lungs:  Respirations even and unlabored.  Clear throughout to auscultation.   No wheezes, crackles, or rhonchi. No acute distress. Heart:  Regular rate and rhythm; no murmurs, clicks, rubs, or gallops. Abdomen:  Normal bowel sounds. Soft, epigastric tenderness, severely distended, tympanic without masses, hepatosplenomegaly or hernias noted.  No guarding or rebound tenderness.   Rectal: Not performed Msk:  Symmetrical without gross deformities. Good, equal movement & strength bilaterally. Pulses:  Normal pulses noted. Extremities:  No clubbing or edema.  No cyanosis. Neurologic:  Alert and oriented x3;  grossly normal neurologically. Skin:  Intact without significant lesions or rashes. No jaundice. Psych:  Alert and cooperative. Normal mood and affect.  Imaging Studies: Reviewed  Assessment and Plan:   SHARAD GUZMAN is a 47 y.o. male with obesity, BMI  35, hypertension is seen in consultation for 3 weeks history nonbloody watery diarrhea associated with severe abdominal bloating, epigastric discomfort.  Patient is also taking large amounts of Tums daily, along with probiotics.  Watery diarrhea has improved, other symptoms are persistent Recommend H. pylori breath test Recommend GI profile PCR Recommend EGD for further evaluation with gastric and duodenal biopsies, esophageal biopsies with history of chronic GERD and colonoscopy with possible TI evaluation for colon cancer screening.  If stool studies are negative for infection, recommend random colon biopsies If above workup is negative, evaluate for pancreatic insufficiency and biliary pathology, will proceed with abdominal imaging Continue probiotics Continue Protonix 40 mg daily Continue to stay away from red meat, completely eliminate carbonated beverages, any sugary drinks including artificial sweeteners Samples of Creon and FD guard provided   Follow up in 3 months, contact via MyChart as needed   Cephas Darby,  MD

## 2022-07-27 NOTE — Patient Instructions (Addendum)
Take 1 capsule with the first bite of each meal and 1 capsule with the first bite of each snack of creon.  Take FDguard as needed for Abdominal bloating  Gave a sample of the bowel prep.

## 2022-07-29 ENCOUNTER — Other Ambulatory Visit: Payer: Self-pay

## 2022-07-29 ENCOUNTER — Encounter: Payer: Self-pay | Admitting: Anesthesiology

## 2022-07-29 ENCOUNTER — Encounter: Payer: Self-pay | Admitting: Gastroenterology

## 2022-07-29 LAB — H. PYLORI BREATH TEST: H pylori Breath Test: NEGATIVE

## 2022-07-31 LAB — GI PROFILE, STOOL, PCR
Adenovirus F 40/41: NOT DETECTED
Astrovirus: NOT DETECTED
C difficile toxin A/B: NOT DETECTED
Campylobacter: NOT DETECTED
Cryptosporidium: NOT DETECTED
Cyclospora cayetanensis: NOT DETECTED
Entamoeba histolytica: NOT DETECTED
Enteroaggregative E coli: NOT DETECTED
Enteropathogenic E coli: NOT DETECTED
Enterotoxigenic E coli: NOT DETECTED
Giardia lamblia: NOT DETECTED
Norovirus GI/GII: DETECTED — AB
Plesiomonas shigelloides: NOT DETECTED
Rotavirus A: NOT DETECTED
Salmonella: NOT DETECTED
Sapovirus: DETECTED — AB
Shiga-toxin-producing E coli: NOT DETECTED
Shigella/Enteroinvasive E coli: NOT DETECTED
Vibrio cholerae: NOT DETECTED
Vibrio: NOT DETECTED
Yersinia enterocolitica: NOT DETECTED

## 2022-08-02 ENCOUNTER — Telehealth: Payer: Self-pay

## 2022-08-02 NOTE — Telephone Encounter (Signed)
-----   Message from Lin Landsman, MD sent at 08/01/2022  9:27 AM EDT ----- Please inform patient that his stool studies came back positive for norovirus and Sapo virus which explains his symptoms of severe diarrhea.  This is a self-limited infection and will resolve on its own However, I recommend EGD for history of chronic GERD as well as colonoscopy for screening  Rohini Vanga

## 2022-08-02 NOTE — Telephone Encounter (Signed)
Patient states the medication for creon is really helping but is almost out of samples. He is wanting to know if he can get more samples. Informed patient yes and we can also apply for patient assistance. He will come by to fill out forms and bring a W2.

## 2022-08-02 NOTE — Telephone Encounter (Signed)
Patient states the creon is helping and is wanting to know he can keep taking the medications? Patient verbalized understanding of results. EGD is already added to colonoscopy

## 2022-08-03 ENCOUNTER — Telehealth: Payer: Self-pay

## 2022-08-03 MED ORDER — PANCRELIPASE (LIP-PROT-AMYL) 36000-114000 UNITS PO CPEP: ORAL_CAPSULE | ORAL | 3 refills | Status: AC

## 2022-08-03 NOTE — Telephone Encounter (Signed)
Patient came in the office to pick up creon samples. Patient also signed patient assistance form for Creon medication. Faxed patient assistance form to the patient assistance.

## 2022-08-05 NOTE — Telephone Encounter (Signed)
Called [employer] to check the status of the creon patient assistance and they said the applications is still processing but should have a answered by Monday

## 2022-08-07 ENCOUNTER — Emergency Department: Payer: Self-pay

## 2022-08-07 ENCOUNTER — Inpatient Hospital Stay
Admission: EM | Admit: 2022-08-07 | Discharge: 2022-08-09 | DRG: 205 | Disposition: A | Discharge status: Home / Self Care | Payer: Self-pay | Attending: Internal Medicine | Admitting: Internal Medicine

## 2022-08-07 ENCOUNTER — Encounter: Payer: Self-pay | Admitting: Emergency Medicine

## 2022-08-07 ENCOUNTER — Other Ambulatory Visit: Payer: Self-pay

## 2022-08-07 DIAGNOSIS — Z888 Allergy status to other drugs, medicaments and biological substances status: Secondary | ICD-10-CM

## 2022-08-07 DIAGNOSIS — J680 Bronchitis and pneumonitis due to chemicals, gases, fumes and vapors: Principal | ICD-10-CM | POA: Diagnosis present

## 2022-08-07 DIAGNOSIS — Z574 Occupational exposure to toxic agents in agriculture: Secondary | ICD-10-CM

## 2022-08-07 DIAGNOSIS — I10 Essential (primary) hypertension: Secondary | ICD-10-CM | POA: Diagnosis present

## 2022-08-07 DIAGNOSIS — G4733 Obstructive sleep apnea (adult) (pediatric): Secondary | ICD-10-CM | POA: Diagnosis present

## 2022-08-07 DIAGNOSIS — F17210 Nicotine dependence, cigarettes, uncomplicated: Secondary | ICD-10-CM | POA: Diagnosis present

## 2022-08-07 DIAGNOSIS — F17200 Nicotine dependence, unspecified, uncomplicated: Secondary | ICD-10-CM | POA: Diagnosis present

## 2022-08-07 DIAGNOSIS — M545 Low back pain, unspecified: Secondary | ICD-10-CM | POA: Diagnosis present

## 2022-08-07 DIAGNOSIS — F32A Depression, unspecified: Secondary | ICD-10-CM | POA: Diagnosis present

## 2022-08-07 DIAGNOSIS — J441 Chronic obstructive pulmonary disease with (acute) exacerbation: Secondary | ICD-10-CM | POA: Diagnosis present

## 2022-08-07 DIAGNOSIS — K219 Gastro-esophageal reflux disease without esophagitis: Secondary | ICD-10-CM | POA: Diagnosis present

## 2022-08-07 DIAGNOSIS — Z6841 Body Mass Index (BMI) 40.0 and over, adult: Secondary | ICD-10-CM

## 2022-08-07 DIAGNOSIS — Z1152 Encounter for screening for COVID-19: Secondary | ICD-10-CM

## 2022-08-07 DIAGNOSIS — Z79899 Other long term (current) drug therapy: Secondary | ICD-10-CM

## 2022-08-07 DIAGNOSIS — X58XXXA Exposure to other specified factors, initial encounter: Secondary | ICD-10-CM | POA: Diagnosis present

## 2022-08-07 DIAGNOSIS — E876 Hypokalemia: Secondary | ICD-10-CM | POA: Diagnosis not present

## 2022-08-07 DIAGNOSIS — M549 Dorsalgia, unspecified: Secondary | ICD-10-CM

## 2022-08-07 DIAGNOSIS — J9601 Acute respiratory failure with hypoxia: Secondary | ICD-10-CM

## 2022-08-07 DIAGNOSIS — Z841 Family history of disorders of kidney and ureter: Secondary | ICD-10-CM

## 2022-08-07 DIAGNOSIS — D72829 Elevated white blood cell count, unspecified: Secondary | ICD-10-CM

## 2022-08-07 DIAGNOSIS — G8929 Other chronic pain: Secondary | ICD-10-CM | POA: Diagnosis present

## 2022-08-07 DIAGNOSIS — R070 Pain in throat: Secondary | ICD-10-CM | POA: Diagnosis present

## 2022-08-07 DIAGNOSIS — Z8249 Family history of ischemic heart disease and other diseases of the circulatory system: Secondary | ICD-10-CM

## 2022-08-07 DIAGNOSIS — G894 Chronic pain syndrome: Secondary | ICD-10-CM | POA: Diagnosis present

## 2022-08-07 DIAGNOSIS — R5381 Other malaise: Secondary | ICD-10-CM | POA: Diagnosis present

## 2022-08-07 LAB — CBC WITH DIFFERENTIAL/PLATELET
Abs Immature Granulocytes: 0.06 10*3/uL (ref 0.00–0.07)
Basophils Absolute: 0.1 10*3/uL (ref 0.0–0.1)
Basophils Relative: 1 %
Eosinophils Absolute: 0.1 10*3/uL (ref 0.0–0.5)
Eosinophils Relative: 1 %
HCT: 40.1 % (ref 39.0–52.0)
Hemoglobin: 13.3 g/dL (ref 13.0–17.0)
Immature Granulocytes: 0 %
Lymphocytes Relative: 7 %
Lymphs Abs: 1.2 10*3/uL (ref 0.7–4.0)
MCH: 29.6 pg (ref 26.0–34.0)
MCHC: 33.2 g/dL (ref 30.0–36.0)
MCV: 89.3 fL (ref 80.0–100.0)
Monocytes Absolute: 1 10*3/uL (ref 0.1–1.0)
Monocytes Relative: 6 %
Neutro Abs: 15.9 10*3/uL — ABNORMAL HIGH (ref 1.7–7.7)
Neutrophils Relative %: 85 %
Platelets: 249 10*3/uL (ref 150–400)
RBC: 4.49 MIL/uL (ref 4.22–5.81)
RDW: 13 % (ref 11.5–15.5)
WBC: 18.3 10*3/uL — ABNORMAL HIGH (ref 4.0–10.5)
nRBC: 0 % (ref 0.0–0.2)

## 2022-08-07 LAB — RESP PANEL BY RT-PCR (RSV, FLU A&B, COVID)  RVPGX2
Influenza A by PCR: NEGATIVE
Influenza B by PCR: NEGATIVE
Resp Syncytial Virus by PCR: NEGATIVE
SARS Coronavirus 2 by RT PCR: NEGATIVE

## 2022-08-07 LAB — CBC
HCT: 38.5 % — ABNORMAL LOW (ref 39.0–52.0)
Hemoglobin: 12.7 g/dL — ABNORMAL LOW (ref 13.0–17.0)
MCH: 29.6 pg (ref 26.0–34.0)
MCHC: 33 g/dL (ref 30.0–36.0)
MCV: 89.7 fL (ref 80.0–100.0)
Platelets: 235 10*3/uL (ref 150–400)
RBC: 4.29 MIL/uL (ref 4.22–5.81)
RDW: 13.2 % (ref 11.5–15.5)
WBC: 16.6 10*3/uL — ABNORMAL HIGH (ref 4.0–10.5)
nRBC: 0 % (ref 0.0–0.2)

## 2022-08-07 LAB — BLOOD GAS, VENOUS
Acid-Base Excess: 3.7 mmol/L — ABNORMAL HIGH (ref 0.0–2.0)
Bicarbonate: 28.5 mmol/L — ABNORMAL HIGH (ref 20.0–28.0)
O2 Saturation: 52.9 %
Patient temperature: 37
pCO2, Ven: 43 mmHg — ABNORMAL LOW (ref 44–60)
pH, Ven: 7.43 (ref 7.25–7.43)
pO2, Ven: 31 mmHg — CL (ref 32–45)

## 2022-08-07 LAB — TROPONIN I (HIGH SENSITIVITY)
Troponin I (High Sensitivity): 5 ng/L (ref <18)
Troponin I (High Sensitivity): 6 ng/L (ref <18)

## 2022-08-07 LAB — BASIC METABOLIC PANEL
Anion gap: 6 (ref 5–15)
BUN: 14 mg/dL (ref 6–20)
CO2: 24 mmol/L (ref 22–32)
Calcium: 8.8 mg/dL — ABNORMAL LOW (ref 8.9–10.3)
Chloride: 104 mmol/L (ref 98–111)
Creatinine, Ser: 0.77 mg/dL (ref 0.61–1.24)
GFR, Estimated: >60 mL/min (ref >60)
Glucose, Bld: 117 mg/dL — ABNORMAL HIGH (ref 70–99)
Potassium: 3.8 mmol/L (ref 3.5–5.1)
Sodium: 134 mmol/L — ABNORMAL LOW (ref 135–145)

## 2022-08-07 LAB — HIV ANTIBODY (ROUTINE TESTING W REFLEX): HIV Screen 4th Generation wRfx: NONREACTIVE

## 2022-08-07 LAB — BRAIN NATRIURETIC PEPTIDE: B Natriuretic Peptide: 56.9 pg/mL (ref 0.0–100.0)

## 2022-08-07 MED ORDER — METHYLPREDNISOLONE SODIUM SUCC 125 MG IJ SOLR
125.0000 mg | Freq: Two times a day (BID) | INTRAMUSCULAR | Status: AC
  Administered 2022-08-07 – 2022-08-08 (×2): 125 mg via INTRAVENOUS
  Filled 2022-08-07 (×2): qty 2

## 2022-08-07 MED ORDER — DICLOFENAC SODIUM 1 % EX GEL
4.0000 g | Freq: Four times a day (QID) | CUTANEOUS | Status: DC | PRN
  Administered 2022-08-07 – 2022-08-08 (×2): 4 g via TOPICAL
  Filled 2022-08-07: qty 100

## 2022-08-07 MED ORDER — SODIUM CHLORIDE 0.9 % IV SOLN
1.0000 g | INTRAVENOUS | Status: DC
  Administered 2022-08-07 – 2022-08-08 (×2): 1 g via INTRAVENOUS
  Filled 2022-08-07 (×3): qty 10

## 2022-08-07 MED ORDER — GABAPENTIN 300 MG PO CAPS
300.0000 mg | ORAL_CAPSULE | Freq: Once | ORAL | Status: AC
  Administered 2022-08-07: 300 mg via ORAL
  Filled 2022-08-07: qty 1

## 2022-08-07 MED ORDER — ENOXAPARIN SODIUM 40 MG/0.4ML IJ SOSY: 40.0000 mg | PREFILLED_SYRINGE | INTRAMUSCULAR | Status: DC

## 2022-08-07 MED ORDER — NICOTINE 21 MG/24HR TD PT24
21.0000 mg | MEDICATED_PATCH | Freq: Every day | TRANSDERMAL | Status: DC
  Administered 2022-08-08 – 2022-08-09 (×2): 21 mg via TRANSDERMAL
  Filled 2022-08-07 (×3): qty 1

## 2022-08-07 MED ORDER — ACETAMINOPHEN 500 MG PO TABS
1000.0000 mg | ORAL_TABLET | Freq: Four times a day (QID) | ORAL | Status: DC | PRN
  Administered 2022-08-07 – 2022-08-08 (×2): 1000 mg via ORAL
  Filled 2022-08-07 (×3): qty 2

## 2022-08-07 MED ORDER — ONDANSETRON HCL 4 MG/2ML IJ SOLN: 4.0000 mg | Freq: Four times a day (QID) | INTRAMUSCULAR | Status: DC | PRN

## 2022-08-07 MED ORDER — MAGIC MOUTHWASH W/LIDOCAINE
10.0000 mL | Freq: Four times a day (QID) | ORAL | Status: DC | PRN
  Administered 2022-08-07 – 2022-08-08 (×2): 10 mL via ORAL
  Filled 2022-08-07 (×5): qty 10

## 2022-08-07 MED ORDER — ONDANSETRON HCL 4 MG PO TABS: 4.0000 mg | ORAL_TABLET | Freq: Four times a day (QID) | ORAL | Status: DC | PRN

## 2022-08-07 MED ORDER — ENOXAPARIN SODIUM 80 MG/0.8ML IJ SOSY
0.5000 mg/kg | PREFILLED_SYRINGE | INTRAMUSCULAR | Status: DC
  Administered 2022-08-07 – 2022-08-08 (×2): 67.5 mg via SUBCUTANEOUS
  Filled 2022-08-07: qty 0.68
  Filled 2022-08-07: qty 0.8

## 2022-08-07 MED ORDER — LOSARTAN POTASSIUM 50 MG PO TABS
100.0000 mg | ORAL_TABLET | Freq: Every day | ORAL | Status: DC
  Administered 2022-08-08 – 2022-08-09 (×2): 100 mg via ORAL
  Filled 2022-08-07 (×2): qty 2

## 2022-08-07 MED ORDER — FLUOXETINE HCL 20 MG PO CAPS
20.0000 mg | ORAL_CAPSULE | Freq: Every day | ORAL | Status: DC
  Administered 2022-08-07 – 2022-08-08 (×2): 20 mg via ORAL
  Filled 2022-08-07 (×2): qty 1

## 2022-08-07 MED ORDER — GABAPENTIN 100 MG PO CAPS
200.0000 mg | ORAL_CAPSULE | Freq: Once | ORAL | Status: AC
  Administered 2022-08-07: 200 mg via ORAL
  Filled 2022-08-07: qty 2

## 2022-08-07 MED ORDER — ALBUTEROL SULFATE (2.5 MG/3ML) 0.083% IN NEBU
2.5000 mg | INHALATION_SOLUTION | RESPIRATORY_TRACT | Status: DC | PRN
  Administered 2022-08-07 – 2022-08-08 (×2): 2.5 mg via RESPIRATORY_TRACT
  Filled 2022-08-07 (×2): qty 3

## 2022-08-07 MED ORDER — METHYLPREDNISOLONE SODIUM SUCC 125 MG IJ SOLR
60.0000 mg | Freq: Once | INTRAMUSCULAR | Status: AC
  Administered 2022-08-07: 60 mg via INTRAVENOUS
  Filled 2022-08-07: qty 2

## 2022-08-07 MED ORDER — METHOCARBAMOL 500 MG PO TABS
1000.0000 mg | ORAL_TABLET | Freq: Once | ORAL | Status: AC
  Administered 2022-08-07: 1000 mg via ORAL
  Filled 2022-08-07: qty 2

## 2022-08-07 MED ORDER — GABAPENTIN 100 MG PO CAPS
100.0000 mg | ORAL_CAPSULE | Freq: Every day | ORAL | Status: DC
  Administered 2022-08-07 – 2022-08-08 (×2): 100 mg via ORAL
  Filled 2022-08-07 (×2): qty 1

## 2022-08-07 MED ORDER — IPRATROPIUM-ALBUTEROL 0.5-2.5 (3) MG/3ML IN SOLN
9.0000 mL | Freq: Once | RESPIRATORY_TRACT | Status: AC
  Administered 2022-08-07: 9 mL via RESPIRATORY_TRACT
  Filled 2022-08-07: qty 9

## 2022-08-07 MED ORDER — AMLODIPINE BESYLATE 10 MG PO TABS
10.0000 mg | ORAL_TABLET | Freq: Every day | ORAL | Status: DC
  Administered 2022-08-08: 10 mg via ORAL
  Filled 2022-08-07 (×2): qty 1

## 2022-08-07 MED ORDER — PREDNISONE 20 MG PO TABS
60.0000 mg | ORAL_TABLET | Freq: Once | ORAL | Status: AC
  Administered 2022-08-07: 60 mg via ORAL
  Filled 2022-08-07: qty 3

## 2022-08-07 MED ORDER — SODIUM CHLORIDE 0.9 % IV SOLN: INTRAVENOUS | Status: DC

## 2022-08-07 MED ORDER — PREDNISONE 20 MG PO TABS
40.0000 mg | ORAL_TABLET | Freq: Every day | ORAL | Status: DC
  Administered 2022-08-09: 40 mg via ORAL
  Filled 2022-08-07: qty 2

## 2022-08-07 NOTE — Assessment & Plan Note (Signed)
Inhalation pneumonitis induced COPD exacerbation in the setting of excessive week killer respiratory intake (RM43 Total Vegetation Control) Post control contacted Supportive care Will proceed down COPD exacerbation treatment protocol Follow

## 2022-08-07 NOTE — Assessment & Plan Note (Signed)
Decompensated respiratory status now requiring BiPAP in the setting of COPD flare associated with inhalation pneumonitis from weed killer respiratory intake (RM43 Total Vegetation Control ) IV Solu-Medrol DuoNebs IV Rocephin Continue positive pressure ventilation for now Monitor respiratory status closely

## 2022-08-07 NOTE — ED Provider Notes (Signed)
[facility] Provider Note    Event Date/Time   First MD Initiated Contact with Patient 08/07/22 1126     (approximate)   History   Shortness of Breath   HPI  Richard Ferguson is a 47 y.o. male   Past medical history of COPD not on home O2, OSA on CPAP, who presents to the emergency department with difficulty breathing, dry cough.  He sprayed weed killer on Thursday and then progressively worsening shortness of breath and cough over the last 2 days.  No chest pain.  There is chest tightness.  No productive sputum.  No fever.  Diffuse opacities on chest x-ray.  Looks euvolemic.  No rales or wheezing.  Does not look like cholinergic toxicity, is bradycardic but there is no signs of hypersalivation nausea vomiting.  Poison control reveiwed exposure; pneumonitis supportive care.   Increased WOB will do Bipap and admission.   Independent Historian contributed to assessment above: his wife       Physical Exam   Triage Vital Signs: ED Triage Vitals  Enc Vitals Group     BP 08/07/22 1128 130/83     Pulse Rate 08/07/22 1128 (!) 103     Resp 08/07/22 1128 (!) 24     Temp 08/07/22 1128 98.1 F (36.7 C)     Temp Source 08/07/22 1128 Oral     SpO2 08/07/22 1128 96 %     Weight --      Height --      Head Circumference --      Peak Flow --      Pain Score 08/07/22 1125 7     Pain Loc --      Pain Edu? --      Excl. in [place]? --     Most recent vital signs: Vitals:   08/07/22 1128  BP: 130/83  Pulse: (!) 103  Resp: (!) 24  Temp: 98.1 F (36.7 C)  SpO2: 96%    General: Awake, alert CV:  Good peripheral perfusion.  Resp:  Increased work of breathing.  No obvious rales rhonchi focalities or wheezing. Abd:  No distention.  Other:  Tachycardic tachypneic afebrile.  Speaking full sentences.  Appears euvolemic.   ED Results / Procedures / Treatments   Labs (all labs ordered are listed, but only abnormal results are displayed) Labs Reviewed  CBC  WITH DIFFERENTIAL/PLATELET - Abnormal; Notable for the following components:      Result Value   WBC 18.3 (*)    Neutro Abs 15.9 (*)    All other components within normal limits  BASIC METABOLIC PANEL - Abnormal; Notable for the following components:   Sodium 134 (*)    Glucose, Bld 117 (*)    Calcium 8.8 (*)    All other components within normal limits  BLOOD GAS, VENOUS - Abnormal; Notable for the following components:   pCO2, Ven 43 (*)    pO2, Ven 31 (*)    Bicarbonate 28.5 (*)    Acid-Base Excess 3.7 (*)    All other components within normal limits  RESP PANEL BY RT-PCR (RSV, FLU A&B, COVID)  [id number]  BRAIN NATRIURETIC PEPTIDE  BLOOD GAS, ARTERIAL  TROPONIN I (HIGH SENSITIVITY)     I ordered and reviewed the above labs they are notable for white blood cell count is elevated 18  EKG  ED ECG REPORT I, Lucillie Garfinkel, the attending physician, personally viewed and interpreted this ECG.   Date: 08/07/2022  EKG Time: 1127  Rate: 103  Rhythm: sinus tachycardia  Axis: nl  Intervals:none  ST&T Change: No acute ischemic changes    RADIOLOGY I independently reviewed and interpreted chest x-ray and see diffuse opacities bilaterally   PROCEDURES:  Critical Care performed: Yes, see critical care procedure note(s)  .Critical Care  Performed by: Lucillie Garfinkel, MD Authorized by: Lucillie Garfinkel, MD   Critical care provider statement:    Critical care time (minutes):  30   Critical care was time spent personally by me on the following activities:  Development of treatment plan with patient or surrogate, discussions with consultants, evaluation of patient's response to treatment, examination of patient, ordering and review of laboratory studies, ordering and review of radiographic studies, ordering and performing treatments and interventions, pulse oximetry, re-evaluation of patient's condition and review of old [facility] [place] ED: Medications  methylPREDNISolone  sodium succinate (SOLU-MEDROL) 125 mg/2 mL injection 60 mg (has no administration in time range)  losartan (COZAAR) tablet 100 mg (has no administration in time range)  amLODipine (NORVASC) tablet 10 mg (has no administration in time range)  FLUoxetine (PROZAC) capsule 20 mg (has no administration in time range)  gabapentin (NEURONTIN) capsule 100 mg (has no administration in time range)  albuterol (PROVENTIL) (2.5 MG/3ML) 0.083% nebulizer solution 2.5 mg (has no administration in time range)  ipratropium-albuterol (DUONEB) 0.5-2.5 (3) MG/3ML nebulizer solution 9 mL (9 mLs Nebulization Given 08/07/22 1137)  predniSONE (DELTASONE) tablet 60 mg (60 mg Oral Given 08/07/22 1135)    External physician / consultants:  I spoke with hospitalist for admission regarding care plan for this patient.   IMPRESSION / MDM / ASSESSMENT AND PLAN / ED COURSE  I reviewed the triage vital signs and the nursing notes.                                Patient's presentation is most consistent with acute presentation with potential threat to life or bodily function.  Differential diagnosis includes, but is not limited to, inhalation lung injury, pneumonitis, COPD exacerbation, CHF exacerbation, bacterial pneumonia, viral URI, ACS, considered but less likely cholinergic toxicity   The patient is on the cardiac monitor to evaluate for evidence of arrhythmia and/or significant heart rate changes.  MDM: This is a patient with increased work of breathing after exposure to a herbicide progressively worsening over the last 2 days.  No signs of cholinergic toxicity.  Spoke with poison control and they suggested supportive care for pneumonitis.  Will start him on BiPAP and give steroids as well as DuoNebs to treat in case of chemical irritant induced COPD exacerbation.  Appears euvolemic I doubt pulmonary edema.  Admission.        FINAL CLINICAL IMPRESSION(S) / ED DIAGNOSES   Final diagnoses:  Pneumonitis due to  [facility])     Rx / DC Orders   ED Discharge Orders     None        Note:  This document was prepared using Dragon voice recognition software and may include unintentional dictation errors.    Lucillie Garfinkel, MD 08/07/22 [phone]

## 2022-08-07 NOTE — Assessment & Plan Note (Signed)
Continue Neurontin. 

## 2022-08-07 NOTE — Plan of Care (Signed)
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
## 2022-08-07 NOTE — Progress Notes (Signed)
PHARMACIST - PHYSICIAN COMMUNICATION  CONCERNING:  Enoxaparin (Lovenox) for DVT Prophylaxis    RECOMMENDATION: Patient was prescribed enoxaprin 40mg  q24 hours for VTE prophylaxis.   There were no vitals filed for this visit.  There is no height or weight on file to calculate BMI.  Estimated Creatinine Clearance: 162.5 mL/min (by C-G formula based on SCr of 0.77 mg/dL).   Based on [place] patient is candidate for enoxaparin 0.5mg /kg TBW SQ every 24 hours based on BMI being >30.   DESCRIPTION: Pharmacy has adjusted enoxaparin dose per [facility] policy.  Patient is now receiving enoxaparin 67.5 mg every 24 hours    Alison Murray, PharmD Clinical Pharmacist  08/07/2022 1:52 PM

## 2022-08-07 NOTE — Assessment & Plan Note (Signed)
BP stable Titrate home regimen 

## 2022-08-07 NOTE — H&P (Signed)
History and Physical    Patient: Richard Ferguson [id number] DOB: 11-29-1975 DOA: 08/07/2022 DOS: the patient was seen and examined on 08/07/2022 PCP: Sofie Hartigan, MD  Patient coming from: Home  Chief Complaint:  Chief Complaint  Patient presents with   Shortness of Breath   HPI: Richard Ferguson is a 47 y.o. male with medical history significant of chronic back pain, COPD not on home oxygen, hypertension, sleep apnea, tobacco abuse presented with acute respiratory failure with hypoxia, chemical pneumonitis and COPD exacerbation.  History primarily from patient's wife Katie good.  Per report, patient was cutting the yard 2 days ago with patient using excessive amount of weed killer (RM43 Total Vegetation Control).  Per the wife, patient said progressively worsening cough, increased work of breathing and sputum production.  No fevers or chills.  Positive mild malaise.  Minimal chest pain no reports of abdominal pain.  No hemiparesis or confusion.  Baseline 1+ pack per day smoker.  Respiratory status progressively worsened over this timeframe. Presented to the ER afebrile, hemodynamically stable.  Initially on 2 L nasal cannula and transition to BiPAP for respiratory support.  White count 18.3, hemoglobin 13.3, VBG grossly stable, creatinine 0.77.  COVID flu and RSV negative.  Chest x-ray with severe bilateral perihilar airspace disease. Review of Systems: As mentioned in the history of present illness. All other systems reviewed and are negative. Past Medical History:  Diagnosis Date   Back injuries    Closed fracture of capitate bone of right wrist 12/21/2018   Closed fracture of left distal radius 12/21/2018   Closed fracture of right distal radius and ulna, initial encounter 12/21/2018   Closed fracture of triquetrum of right wrist 12/21/2018   Closed transverse fracture of waist of scaphoid, right, initial encounter 12/21/2018   COPD (chronic obstructive pulmonary disease) (HCC)     Dyspnea    Hypertension    Laceration of left leg 12/21/2018   Multiple trauma 12/23/2018   Open nondisp fracture of base of third metacarpal bone of right hand 12/21/2018   Open nondisplaced fracture of base of fourth metacarpal bone of right hand 12/21/2018   Pelvic fracture ([place]) 12/17/2018   Sleep apnea    Past Surgical History:  Procedure Laterality Date   CARPAL TUNNEL RELEASE     OPEN REDUCTION INTERNAL FIXATION (ORIF) DISTAL RADIAL FRACTURE Bilateral 12/18/2018   Procedure: OPEN REDUCTION INTERNAL FIXATION (ORIF) DISTAL RADIAL FRACTURE;  Surgeon: Shona Needles, MD;  Location: [place];  Service: Orthopedics;  Laterality: Bilateral;  laceration repair to left leg   ORIF PELVIC FRACTURE N/A 12/18/2018   Procedure: OPEN REDUCTION INTERNAL FIXATION (ORIF) PELVIC FRACTURE;  Surgeon: Shona Needles, MD;  Location: [place];  Service: Orthopedics;  Laterality: N/A;   Social History:  reports that he has been smoking cigarettes. He has been smoking an average of 2 packs per day. He has never used smokeless tobacco. He reports current alcohol use. He reports that he does not use drugs.  Allergies  Allergen Reactions   Naproxen Sodium Hives    Patient states "broke out in blisters" Patient states "broke out in blisters"     Family History  Problem Relation Age of Onset   Hypertension Mother    Hypertension Father    Renal Disease Father    Hypertension Sister    Hypertension Brother     Prior to Admission medications   Medication Sig Start Date End Date Taking? Authorizing Provider  acetaminophen (TYLENOL) 500 MG  tablet Take 1-2 tablets (500-1,000 mg total) by mouth 4 (four) times daily -  with meals and at bedtime. 12/26/18   Love, Ivan Anchors, PA-C  albuterol (VENTOLIN HFA) 108 (90 Base) MCG/ACT inhaler Inhale into the lungs. 08/26/21   [provider]  amLODipine (NORVASC) 10 MG tablet Take 1 tablet by mouth daily. 06/10/21   [provider]  FLUoxetine (PROZAC) 20 MG  capsule Take by mouth. 07/05/22   [provider]  gabapentin (NEURONTIN) 400 MG capsule Take 1 capsule (400 mg total) by mouth 3 (three) times daily. Patient taking differently: Take 100 mg by mouth at bedtime. 12/26/18   Love, Ivan Anchors, PA-C  hydrochlorothiazide (HYDRODIURIL) 25 MG tablet  06/10/21   [provider]  lipase/protease/amylase (CREON) 36000 UNITS CPEP capsule Take 1 capsules with the first bite of each meal and 1 capsule with the first bite of each snack 08/03/22   Lin Landsman, MD  losartan (COZAAR) 100 MG tablet Take by mouth. 07/09/20   [provider]  ondansetron (ZOFRAN-ODT) 8 MG disintegrating tablet Take 1 tablet (8 mg total) by mouth every 8 (eight) hours as needed for nausea or vomiting. 07/22/22   Margarette Canada, NP  pantoprazole (PROTONIX) 40 MG tablet Take 1 tablet (40 mg total) by mouth daily. 10/02/21 07/27/22  Laurene Footman B, PA-C  sildenafil (REVATIO) 20 MG tablet Take 1-5 45 minutes before sexual activity 04/25/19   [provider]  Grant Ruts INHUB 250-50 MCG/ACT AEPB Inhale into the lungs. 08/26/21   [provider]    Physical Exam: Vitals:   08/07/22 1128 08/07/22 1300  BP: 130/83 125/71  Pulse: (!) 103 (!) 110  Resp: (!) 24 (!) 24  Temp: 98.1 F (36.7 C)   TempSrc: Oral   SpO2: 96% 97%   Physical Exam Constitutional:      Appearance: He is obese.     Comments: BiPAP in place  HENT:     Head: Normocephalic.  Eyes:     Pupils: Pupils are equal, round, and reactive to light.  Cardiovascular:     Rate and Rhythm: Normal rate and regular rhythm.  Pulmonary:     Breath sounds: Wheezing present.     Comments: BiPAP in place Abdominal:     General: Bowel sounds are normal.     Comments: Obese abdomen  Musculoskeletal:        General: Normal range of motion.     Cervical back: Normal range of motion.  Skin:    General: Skin is warm.  Neurological:     General: No focal deficit present.  Psychiatric:         Mood and Affect: Mood normal.     Data Reviewed:  There are no new results to review at this time. DG Chest Port 1 View CLINICAL DATA:  Patient to ED via [facility] from home for SOB. Patient states he inhaled weed killer yesterday while spraying yard. Has used inhaler at home- c/o redness/itching in throat. 2-3 syncopal episodes and non-reproductive cough. Placed on 4L [place] by EMS. sob  EXAM: PORTABLE CHEST 1 VIEW  COMPARISON:  09/14/2021  FINDINGS: Normal cardiac silhouette. There is bilateral perihilar airspace disease which is severe. No consolidation. No pleural fluid. No pneumothorax.  IMPRESSION: Severe bilateral perihilar airspace disease.  Electronically Signed   By: Suzy Bouchard M.D.   On: 08/07/2022 11:48  Lab Results  Component Value Date   WBC 18.3 (H) 08/07/2022   HGB 13.3 08/07/2022  HCT 40.1 08/07/2022   MCV 89.3 08/07/2022   PLT 249 0000000   Last metabolic panel Lab Results  Component Value Date   GLUCOSE 117 (H) 08/07/2022   NA 134 (L) 08/07/2022   K 3.8 08/07/2022   CL 104 08/07/2022   CO2 24 08/07/2022   BUN 14 08/07/2022   CREATININE 0.77 08/07/2022   GFRNONAA >60 08/07/2022   CALCIUM 8.8 (L) 08/07/2022   PROT 7.5 07/22/2022   ALBUMIN 4.1 07/22/2022   BILITOT 0.4 07/22/2022   ALKPHOS 95 07/22/2022   AST 35 07/22/2022   ALT 39 07/22/2022   ANIONGAP 6 08/07/2022    Assessment and Plan: * Chemical pneumonitis (HCC) Inhalation pneumonitis induced COPD exacerbation in the setting of excessive week killer respiratory intake (RM43 Total Vegetation Control) Post control contacted Supportive care Will proceed down COPD exacerbation treatment protocol Follow  Acute respiratory failure with hypoxia (HCC) Decompensated respiratory status now requiring BiPAP in the setting of COPD flare associated with inhalation pneumonitis from weed killer respiratory intake (RM43 Total Vegetation Control ) IV Solu-Medrol DuoNebs IV Rocephin Continue  positive pressure ventilation for now Monitor respiratory status closely  Gastro-esophageal reflux disease without esophagitis PPI  Chronic back pain Continue Neurontin  Essential hypertension BP stable Titrate home regimen  Tobacco use disorder 1 pack/day smoker Nicotine patch      Advance Care Planning:   Code Status: Full Code   Consults: None   Family Communication: Wife Cady at the bedside   Severity of Illness: The appropriate patient status for this patient is INPATIENT. Inpatient status is judged to be reasonable and necessary in order to provide the required intensity of service to ensure the patient's safety. The patient's presenting symptoms, physical exam findings, and initial radiographic and laboratory data in the context of their chronic comorbidities is felt to place them at high risk for further clinical deterioration. Furthermore, it is not anticipated that the patient will be medically stable for discharge from the hospital within 2 midnights of admission.   * I certify that at the point of admission it is my clinical judgment that the patient will require inpatient hospital care spanning beyond 2 midnights from the point of admission due to high intensity of service, high risk for further deterioration and high frequency of surveillance required.*  Author: Deneise Lever, MD 08/07/2022 2:05 PM  For on call review [email or web address].

## 2022-08-07 NOTE — ED Notes (Signed)
RT called at this time to place patient on bi-pap per Jacelyn Grip, MD.

## 2022-08-07 NOTE — Progress Notes (Signed)
Pt taken off bipap, stated that his breathing better. Roomair sats 94%, respiratory rate 22/min.

## 2022-08-07 NOTE — Assessment & Plan Note (Signed)
1 pack/day smoker Nicotine patch

## 2022-08-07 NOTE — ED Notes (Addendum)
Spoke with poison control regarding patient's inhalation of RM43 Total Vegetation Control (weed killer). The recommend supportive care and to look out for possible pneumonitis.

## 2022-08-07 NOTE — ED Triage Notes (Signed)
Patient to ED via [facility] from home for SOB. Patient states he inhaled weed killer yesterday while spraying yard. Has used inhaler at home- c/o redness/itching in throat. 2-3 syncopal episodes and non-reproductive cough. Placed on 4L [place] by EMS.  20 R AC by EMS

## 2022-08-07 NOTE — Assessment & Plan Note (Signed)
PPI ?

## 2022-08-08 ENCOUNTER — Observation Stay: Payer: Self-pay

## 2022-08-08 LAB — CBC
HCT: 36.8 % — ABNORMAL LOW (ref 39.0–52.0)
Hemoglobin: 12.1 g/dL — ABNORMAL LOW (ref 13.0–17.0)
MCH: 29.4 pg (ref 26.0–34.0)
MCHC: 32.9 g/dL (ref 30.0–36.0)
MCV: 89.3 fL (ref 80.0–100.0)
Platelets: 253 10*3/uL (ref 150–400)
RBC: 4.12 MIL/uL — ABNORMAL LOW (ref 4.22–5.81)
RDW: 13.1 % (ref 11.5–15.5)
WBC: 21.3 10*3/uL — ABNORMAL HIGH (ref 4.0–10.5)
nRBC: 0 % (ref 0.0–0.2)

## 2022-08-08 LAB — COMPREHENSIVE METABOLIC PANEL
ALT: 20 U/L (ref 0–44)
AST: 24 U/L (ref 15–41)
Albumin: 3.5 g/dL (ref 3.5–5.0)
Alkaline Phosphatase: 80 U/L (ref 38–126)
Anion gap: 8 (ref 5–15)
BUN: 18 mg/dL (ref 6–20)
CO2: 20 mmol/L — ABNORMAL LOW (ref 22–32)
Calcium: 8.6 mg/dL — ABNORMAL LOW (ref 8.9–10.3)
Chloride: 102 mmol/L (ref 98–111)
Creatinine, Ser: 0.74 mg/dL (ref 0.61–1.24)
GFR, Estimated: >60 mL/min (ref >60)
Glucose, Bld: 205 mg/dL — ABNORMAL HIGH (ref 70–99)
Potassium: 3.3 mmol/L — ABNORMAL LOW (ref 3.5–5.1)
Sodium: 130 mmol/L — ABNORMAL LOW (ref 135–145)
Total Bilirubin: 0.9 mg/dL (ref 0.3–1.2)
Total Protein: 7.4 g/dL (ref 6.5–8.1)

## 2022-08-08 LAB — MAGNESIUM: Magnesium: 1.8 mg/dL (ref 1.7–2.4)

## 2022-08-08 LAB — C-REACTIVE PROTEIN: CRP: 20.6 mg/dL — ABNORMAL HIGH (ref <1.0)

## 2022-08-08 MED ORDER — FUROSEMIDE 10 MG/ML IJ SOLN
40.0000 mg | Freq: Every day | INTRAMUSCULAR | Status: DC
  Administered 2022-08-08 – 2022-08-09 (×2): 40 mg via INTRAVENOUS
  Filled 2022-08-08 (×2): qty 4

## 2022-08-08 MED ORDER — METHYLPREDNISOLONE SODIUM SUCC 40 MG IJ SOLR
40.0000 mg | Freq: Two times a day (BID) | INTRAMUSCULAR | Status: AC
  Administered 2022-08-08 – 2022-08-09 (×2): 40 mg via INTRAVENOUS
  Filled 2022-08-08 (×2): qty 1

## 2022-08-08 MED ORDER — POTASSIUM CHLORIDE 20 MEQ PO PACK
40.0000 meq | PACK | Freq: Once | ORAL | Status: AC
  Administered 2022-08-08: 40 meq via ORAL
  Filled 2022-08-08: qty 2

## 2022-08-08 MED ORDER — GABAPENTIN 300 MG PO CAPS
300.0000 mg | ORAL_CAPSULE | Freq: Three times a day (TID) | ORAL | Status: DC | PRN
  Administered 2022-08-08 – 2022-08-09 (×3): 300 mg via ORAL
  Filled 2022-08-08 (×3): qty 1

## 2022-08-08 MED ORDER — IOHEXOL 350 MG/ML SOLN
75.0000 mL | Freq: Once | INTRAVENOUS | Status: AC | PRN
  Administered 2022-08-08: 75 mL via INTRAVENOUS

## 2022-08-08 MED ORDER — TRAMADOL HCL 50 MG PO TABS
50.0000 mg | ORAL_TABLET | Freq: Four times a day (QID) | ORAL | Status: DC | PRN
  Administered 2022-08-08: 50 mg via ORAL
  Filled 2022-08-08: qty 1

## 2022-08-08 MED ORDER — SODIUM CHLORIDE 0.9 % IV SOLN
500.0000 mg | INTRAVENOUS | Status: DC
  Administered 2022-08-08: 500 mg via INTRAVENOUS
  Filled 2022-08-08 (×2): qty 5

## 2022-08-08 NOTE — Progress Notes (Addendum)
Progress Note   Patient: Richard Ferguson [id number] DOB: 04-07-1976 DOA: 08/07/2022     0 DOS: the patient was seen and examined on 08/08/2022   Brief hospital course:   JAYMISON DENSMORE is a 47 y.o. male with medical history significant of chronic back pain, COPD not on home oxygen, hypertension, sleep apnea, tobacco abuse who presented with acute respiratory failure with hypoxia, chemical pneumonitis and COPD exacerbation.  History primarily from patient's wife Katie good.  Per report, patient was cutting the yard 2 days ago with patient using excessive amount of weed killer (RM43 Total Vegetation Control).  Per the wife, patient said progressively worsening cough, increased work of breathing and sputum production.  No fevers or chills.  Positive mild malaise.  Minimal chest pain no reports of abdominal pain.  No hemiparesis or confusion.  Baseline 1+ pack per day smoker.  Respiratory status progressively worsened over this timeframe. Presented to the ER afebrile, hemodynamically stable.  Initially on 2 L nasal cannula and transition to BiPAP for respiratory support.  White count 18.3, hemoglobin 13.3, VBG grossly stable, creatinine 0.77.  COVID flu and RSV negative.  Chest x-ray with severe bilateral perihilar airspace disease.  Assessment and Plan:  * Chemical pneumonitis (HCC) Inhalation pneumonitis induced COPD exacerbation in the setting of excessive weed killer inhalation (RM43 Total Vegetation Control) Post control contacted Supportive care with systemic steroids, bronchodilators and oxygen as needed Chest x-ray showed severe bilateral perihilar airspace disease.   Will obtain CT scan of the chest without contrast for further evaluation Pulmonary consult  Acute respiratory failure with hypoxia (HCC) Decompensated respiratory status initially required BiPAP in the setting of COPD flare associated with inhalation pneumonitis from weed killer inhalation (RM43 Total Vegetation Control  ) Patient is off BiPAP and is not on oxygen because he said it was drying out his nostrils. Continue IV Solu-Medrol Continue as needed DuoNebs Continue IV Rocephin Monitor respiratory status closely   Gastro-esophageal reflux disease without esophagitis PPI   Chronic back pain Continue Neurontin   Essential hypertension BP is stable on losartan and amlodipine Titrate home regimen   Tobacco use disorder 1 pack/day smoker Smoking cessation has been discussed with patient Nicotine patch    Morbid obesity (BMI [id number]) Complicates overall prognosis and care Lifestyle modification and exercise has been discussed with patient in detail   Hypokalemia Supplement potassium Check magnesium levels    Subjective: Patient is seen and examined at the bedside.  Complains of burning sensation in his chest and is short of breath with moderate exertion which is unusual for him.  Also complains of severe low back pain  Physical Exam: Vitals:   08/08/22 0355 08/08/22 0452 08/08/22 0755 08/08/22 1220  BP: 107/60  137/78 131/77  Pulse: 98  96 92  Resp: 18  (!) 24 (!) 24  Temp: 98.2 F (36.8 C)  97.7 F (36.5 C) 97.8 F (36.6 C)  TempSrc:   Oral Oral  SpO2: 92%  94% 93%  Weight:  136 kg    Height:        Constitutional:      Appearance: He is obese.     Comments: On room air HENT:     Head: Normocephalic.  Eyes:     Pupils: Pupils are equal, round, and reactive to light.  Cardiovascular:     Rate and Rhythm: Normal rate and regular rhythm.  Pulmonary:     Breath sounds: Clear to auscultation.     Comments:  Abdominal:  General: Bowel sounds are normal.     Comments: Central adiposity Musculoskeletal:        General: Normal range of motion.     Cervical back: Normal range of motion.  Skin:    General: Skin is warm.  Neurological:     General: No focal deficit present.  Psychiatric:        Mood and Affect: Mood normal.    Data Reviewed: Labs reviewed.  Marked  leukocytosis related to systemic steroids.  Hypokalemia There are no new results to review at this time.  Family Communication: Discussed at length with patient's wife about his treatment plan.  She verbalizes understanding and agrees to the plan  Disposition: Status is: Observation The patient remains OBS appropriate and will d/c before 2 midnights.  Planned Discharge Destination: Home    Time spent: 35 minutes  Author: Collier Bullock, MD 08/08/2022 1:30 PM  For on call review [email or web address].
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
## 2022-08-09 ENCOUNTER — Telehealth: Payer: Self-pay

## 2022-08-09 LAB — CBC
HCT: 39 % (ref 39.0–52.0)
Hemoglobin: 13.2 g/dL (ref 13.0–17.0)
MCH: 29.7 pg (ref 26.0–34.0)
MCHC: 33.8 g/dL (ref 30.0–36.0)
MCV: 87.8 fL (ref 80.0–100.0)
Platelets: 325 10*3/uL (ref 150–400)
RBC: 4.44 MIL/uL (ref 4.22–5.81)
RDW: 13.1 % (ref 11.5–15.5)
WBC: 25.2 10*3/uL — ABNORMAL HIGH (ref 4.0–10.5)
nRBC: 0 % (ref 0.0–0.2)

## 2022-08-09 LAB — BASIC METABOLIC PANEL
Anion gap: 10 (ref 5–15)
BUN: 23 mg/dL — ABNORMAL HIGH (ref 6–20)
CO2: 21 mmol/L — ABNORMAL LOW (ref 22–32)
Calcium: 9.3 mg/dL (ref 8.9–10.3)
Chloride: 106 mmol/L (ref 98–111)
Creatinine, Ser: 0.85 mg/dL (ref 0.61–1.24)
GFR, Estimated: >60 mL/min (ref >60)
Glucose, Bld: 165 mg/dL — ABNORMAL HIGH (ref 70–99)
Potassium: 4 mmol/L (ref 3.5–5.1)
Sodium: 133 mmol/L — ABNORMAL LOW (ref 135–145)

## 2022-08-09 LAB — LEGIONELLA PNEUMOPHILA TOTAL AB: Legionella Pneumo Total Ab: NONREACTIVE OD ratio (ref 0.00–0.90)

## 2022-08-09 MED ORDER — METHYLPREDNISOLONE 4 MG PO TBPK: ORAL_TABLET | ORAL | 0 refills | Status: DC

## 2022-08-09 MED ORDER — TRAMADOL HCL 50 MG PO TABS
100.0000 mg | ORAL_TABLET | Freq: Once | ORAL | Status: AC
  Administered 2022-08-09: 100 mg via ORAL
  Filled 2022-08-09: qty 2

## 2022-08-09 MED ORDER — AZITHROMYCIN 250 MG PO TABS: ORAL_TABLET | ORAL | 0 refills | Status: AC

## 2022-08-09 MED ORDER — FUROSEMIDE 10 MG/ML IJ SOLN: 40.0000 mg | Freq: Every day | INTRAMUSCULAR | Status: DC

## 2022-08-09 NOTE — Telephone Encounter (Signed)
Called patient wife and she states she was going to call us this morning. She states it is going to be longer then 2 weeks because she is still having trouble breathing. Reschedule to 10/07/2022 and informed kim of the move in endo. Wife states that he only has one bottle of creon left. Gave patient 2 more samples of the medication while we are waiting on patient assistance.

## 2022-08-09 NOTE — Telephone Encounter (Signed)
Per Maudie Mercury in endo This patient is in the hospital with chemical pneumonia. He is scheduled here for 3-21 for colon and anesthesia said needs to be moved out 2 weeks thanks

## 2022-08-09 NOTE — TOC CM/SW Note (Signed)
  Transition of Care [facility]) Screening Note   Patient Details  Name: Richard Ferguson Date of Birth: 01/31/1976   Transition of Care [facility]) CM/SW Contact:    Ross Ludwig, LCSW Phone Number: 08/09/2022, 12:37 PM    Transition of Care Department [facility]) has reviewed patient and no TOC needs have been identified at this time. We will continue to monitor patient advancement through interdisciplinary progression rounds. If new patient transition needs arise, please place a TOC consult.

## 2022-08-09 NOTE — Telephone Encounter (Signed)
Patient has been approved for the Creon 36,000 till 05/24/2023. They have placed the order and the medication will be shipped to the patient home in 7 to 10 business days.

## 2022-08-09 NOTE — Discharge Summary (Addendum)
Physician Discharge Summary   Patient: Richard Ferguson MRN: [id number] DOB: [birth year]  Admit date:     08/07/2022  Discharge date: 08/09/22  Discharge Physician: Jaycee Mckellips   PCP: Sofie Hartigan, MD   Recommendations at discharge:   Complete medications as recommended Need to quit smoking Follow-up pulmonary as an outpatient  Discharge Diagnoses: Principal Problem:   Chemical pneumonitis ([place]) Active Problems:   Acute respiratory failure with hypoxia (HCC)   Tobacco use disorder   Essential hypertension   Chronic back pain   Gastro-esophageal reflux disease without esophagitis   Obesity, Class III, BMI 40-49.9 (morbid obesity) ([place])  Resolved Problems:   * No resolved hospital problems. [facility] Course: Richard Ferguson is a 47 y.o. male with medical history significant of chronic back pain, COPD not on home oxygen, hypertension, sleep apnea, tobacco abuse presented with acute respiratory failure with hypoxia, chemical pneumonitis and COPD exacerbation.  History primarily from patient's wife Katie good.  Per report, patient was cutting the yard 2 days prior to his admission with patient using excessive amount of weed killer (RM43 Total Vegetation Control).  Per the wife, patient said progressively worsening cough, increased work of breathing and sputum production.  No fevers or chills.  Positive mild malaise.  Minimal chest pain no reports of abdominal pain.  No hemiparesis or confusion.  Baseline 1+ pack per day smoker.  Respiratory status progressively worsened over this timeframe. Presented to the ER afebrile, hemodynamically stable.  Initially on 2 L nasal cannula and transition to BiPAP for respiratory support.  White count 18.3, hemoglobin 13.3, VBG grossly stable, creatinine 0.77.  COVID flu and RSV negative.  Chest x-ray with severe bilateral perihilar airspace disease.      Assessment and Plan: * Chemical pneumonitis (HCC) Inhalation pneumonitis induced COPD  exacerbation in the setting of excessive weed killer inhalation (RM43 Total Vegetation Control) Post control contacted Supportive care with systemic steroids, bronchodilators and oxygen as needed Chest x-ray showed severe bilateral perihilar airspace disease.   CT scan of the chest without contrast showed no CT evidence for acute pulmonary embolus. Central predominant ground-glass opacity in all lobes of both lungs with peripheral sparing. Imaging features can be seen in the setting of inhalational injury, pulmonary edema, atypical infection, or hemorrhage. Appreciate pulmonary input Patient will be discharged home to follow-up with pulmonary as an outpatient    Acute respiratory failure with hypoxia (HCC) Decompensated respiratory status initially required BiPAP in the setting of COPD flare associated with inhalation pneumonitis from weed killer inhalation (RM43 Total Vegetation Control ) Patient is off BiPAP and is not on oxygen because he said it was drying out his nostrils. Patient was ambulated in the hallway and post ambulatory pulse ox was 93%   Gastro-esophageal reflux disease without esophagitis PPI   Chronic back pain Continue Neurontin   Essential hypertension BP is stable on losartan and amlodipine Titrate home regimen   Tobacco use disorder 1 pack/day smoker Smoking cessation has been discussed with patient Nicotine patch     Morbid obesity (BMI [id number]) Complicates overall prognosis and care Lifestyle modification and exercise has been discussed with patient in detail     Hypokalemia Supplement potassium Check magnesium levels   Depression Continue Prozac   Morbid obesity (BMI [id number]) Complicates overall prognosis and care     Consultants: Pulmonary Procedures performed: Non invasive mechanical ventilation Disposition: Home Diet recommendation:  Discharge Diet Orders (From admission, onward)     Start  Ordered   08/09/22 0000  Diet - low sodium  heart healthy        08/09/22 1224           Cardiac diet DISCHARGE MEDICATION: Allergies as of 08/09/2022       Reactions   Naproxen Sodium Hives   Patient states "broke out in blisters" Patient states "broke out in blisters"        Medication List     STOP taking these medications    sildenafil 20 MG tablet Commonly known as: REVATIO       TAKE these medications    acetaminophen 500 MG tablet Commonly known as: TYLENOL Take 1-2 tablets (500-1,000 mg total) by mouth 4 (four) times daily -  with meals and at bedtime.   albuterol 108 (90 Base) MCG/ACT inhaler Commonly known as: VENTOLIN HFA Inhale into the lungs.   amLODipine 10 MG tablet Commonly known as: NORVASC Take 1 tablet by mouth daily.   azithromycin 250 MG tablet Commonly known as: Zithromax Z-Pak Take 2 tablets (500 mg) on  Day 1,  followed by 1 tablet (250 mg) once daily on Days 2 through 5.   FLUoxetine 20 MG capsule Commonly known as: PROZAC Take by mouth.   gabapentin 400 MG capsule Commonly known as: NEURONTIN Take 1 capsule (400 mg total) by mouth 3 (three) times daily. What changed:  how much to take when to take this   hydrochlorothiazide 25 MG tablet Commonly known as: HYDRODIURIL   ibuprofen 200 MG tablet Commonly known as: ADVIL Take 200 mg by mouth every 6 (six) hours as needed for fever, headache, mild pain, moderate pain or cramping.   lipase/protease/amylase 36000 UNITS Cpep capsule Commonly known as: Creon Take 1 capsules with the first bite of each meal and 1 capsule with the first bite of each snack   losartan 100 MG tablet Commonly known as: COZAAR Take by mouth.   methylPREDNISolone 4 MG Tbpk tablet Commonly known as: MEDROL DOSEPAK Follow package instructions   ondansetron 8 MG disintegrating tablet Commonly known as: ZOFRAN-ODT Take 1 tablet (8 mg total) by mouth every 8 (eight) hours as needed for nausea or vomiting.   pantoprazole 40 MG  tablet Commonly known as: PROTONIX Take 1 tablet (40 mg total) by mouth daily.   rosuvastatin 5 MG tablet Commonly known as: CRESTOR Take 5 mg by mouth daily.   Wixela Inhub 250-50 MCG/ACT Aepb Generic drug: fluticasone-salmeterol Inhale into the lungs.        Follow-up Information     Ottie Glazier, MD Follow up in 1 week(s).   Specialty: Pulmonary Disease Contact information: [place] [address] [phone]                Discharge Exam: Danley Danker Weights   08/07/22 1630 08/08/22 0452 08/09/22 0326  Weight: 135.2 kg 136 kg (!) 140.2 kg    Appearance: He is obese.     Comments: On room air HENT:     Head: Normocephalic.  Eyes:     Pupils: Pupils are equal, round, and reactive to light.  Cardiovascular:     Rate and Rhythm: Normal rate and regular rhythm.  Pulmonary:     Breath sounds: Clear to auscultation.     Comments:  Abdominal:     General: Bowel sounds are normal.     Comments: Central adiposity Musculoskeletal:        General: Normal range of motion.     Cervical back:  Normal range of motion.  Skin:    General: Skin is warm.  Neurological:     General: No focal deficit present.  Psychiatric:        Mood and Affect: Mood normal.     Condition at discharge: stable  The results of significant diagnostics from this hospitalization (including imaging, microbiology, ancillary and laboratory) are listed below for reference.   Imaging Studies: CT Angio Chest Pulmonary Embolism (PE) W or WO Contrast  Result Date: 08/08/2022 CLINICAL DATA:  Shortness of breath.  Inhalational injury. EXAM: CT ANGIOGRAPHY CHEST WITH CONTRAST TECHNIQUE: Multidetector CT imaging of the chest was performed using the standard protocol during bolus administration of intravenous contrast. Multiplanar CT image reconstructions and MIPs were obtained to evaluate the vascular anatomy. RADIATION DOSE REDUCTION: This exam was performed according to the  departmental dose-optimization program which includes automated exposure control, adjustment of the mA and/or kV according to patient size and/or use of iterative reconstruction technique. CONTRAST:  66mL OMNIPAQUE IOHEXOL 350 MG/ML SOLN COMPARISON:  02/24/2019 FINDINGS: Cardiovascular: Heart size upper normal to mildly increased. No substantial pericardial effusion. No thoracic aortic aneurysm. There is no filling defect within the opacified pulmonary arteries to suggest the presence of an acute pulmonary embolus. Mediastinum/Nodes: No mediastinal lymphadenopathy. There is no hilar lymphadenopathy. The esophagus has normal imaging features. There is no axillary lymphadenopathy. Lungs/Pleura: Central predominant ground-glass opacity is seen in all lobes of both lungs with peripheral sparing. No pleural effusion. Upper Abdomen: Visualized portion of the upper abdomen is unremarkable. Musculoskeletal: No worrisome lytic or sclerotic osseous abnormality. Review of the MIP images confirms the above findings. IMPRESSION: 1. No CT evidence for acute pulmonary embolus. 2. Central predominant ground-glass opacity in all lobes of both lungs with peripheral sparing. Imaging features can be seen in the setting of inhalational injury, pulmonary edema, atypical infection, or hemorrhage. Electronically Signed   By: Misty Stanley M.D.   On: 08/08/2022 15:18   DG Chest Port 1 View  Result Date: 08/07/2022 CLINICAL DATA:  Patient to ED via [facility] from home for SOB. Patient states he inhaled weed killer yesterday while spraying yard. Has used inhaler at home- c/o redness/itching in throat. 2-3 syncopal episodes and non-reproductive cough. Placed on 4L  by EMS. sob EXAM: PORTABLE CHEST 1 VIEW COMPARISON:  09/14/2021 FINDINGS: Normal cardiac silhouette. There is bilateral perihilar airspace disease which is severe. No consolidation. No pleural fluid. No pneumothorax. IMPRESSION: Severe bilateral perihilar airspace disease.  Electronically Signed   By: Suzy Bouchard M.D.   On: 08/07/2022 11:48   DG Abdomen 1 View  Result Date: 07/22/2022 CLINICAL DATA:  Abdominal pain EXAM: ABDOMEN - 1 VIEW COMPARISON:  None Available. FINDINGS: The bowel gas pattern is normal. No radio-opaque calculi or other significant radiographic abnormality are seen. Patient is status post sacroiliac fusion with 2 screws right to left. IMPRESSION: Negative. Electronically Signed   By: Sammie Bench M.D.   On: 07/22/2022 19:57    Microbiology: Results for orders placed or performed during the hospital encounter of 08/07/22  Resp panel by RT-PCR (RSV, Flu A&B, Covid) Anterior Nasal Swab     Status: None   Collection Time: 08/07/22 11:25 AM   Specimen: Anterior Nasal Swab  Result Value Ref Range Status   SARS Coronavirus 2 by RT PCR NEGATIVE NEGATIVE Final    Comment: (NOTE) SARS-CoV-2 target nucleic acids are NOT DETECTED.  The SARS-CoV-2 RNA is generally detectable in upper respiratory specimens during the acute phase of infection. The  lowest concentration of SARS-CoV-2 viral copies this assay can detect is 138 copies/mL. A negative result does not preclude SARS-Cov-2 infection and should not be used as the sole basis for treatment or other patient management decisions. A negative result may occur with  improper specimen collection/handling, submission of specimen other than nasopharyngeal swab, presence of viral mutation(s) within the areas targeted by this assay, and inadequate number of viral copies(<138 copies/mL). A negative result must be combined with clinical observations, patient history, and epidemiological information. The expected result is Negative.  Fact Sheet for Patients:  EntrepreneurPulse.com.au  Fact Sheet for Healthcare Providers:  IncredibleEmployment.be  This test is no t yet approved or cleared by the [place] FDA and  has been authorized for detection and/or  diagnosis of SARS-CoV-2 by FDA under an Emergency Use Authorization (EUA). This EUA will remain  in effect (meaning this test can be used) for the duration of the COVID-19 declaration under Section 564(b)(1) of the Act, 21 U.S.C.section 360bbb-3(b)(1), unless the authorization is terminated  or revoked sooner.       Influenza A by PCR NEGATIVE NEGATIVE Final   Influenza B by PCR NEGATIVE NEGATIVE Final    Comment: (NOTE) The Xpert Xpress SARS-CoV-2/FLU/RSV plus assay is intended as an aid in the diagnosis of influenza from Nasopharyngeal swab specimens and should not be used as a sole basis for treatment. Nasal washings and aspirates are unacceptable for Xpert Xpress SARS-CoV-2/FLU/RSV testing.  Fact Sheet for Patients: EntrepreneurPulse.com.au  Fact Sheet for Healthcare Providers: IncredibleEmployment.be  This test is not yet approved or cleared by the [place] FDA and has been authorized for detection and/or diagnosis of SARS-CoV-2 by FDA under an Emergency Use Authorization (EUA). This EUA will remain in effect (meaning this test can be used) for the duration of the COVID-19 declaration under Section 564(b)(1) of the Act, 21 U.S.C. section 360bbb-3(b)(1), unless the authorization is terminated or revoked.     Resp Syncytial Virus by PCR NEGATIVE NEGATIVE Final    Comment: (NOTE) Fact Sheet for Patients: EntrepreneurPulse.com.au  Fact Sheet for Healthcare Providers: IncredibleEmployment.be  This test is not yet approved or cleared by the [place] FDA and has been authorized for detection and/or diagnosis of SARS-CoV-2 by FDA under an Emergency Use Authorization (EUA). This EUA will remain in effect (meaning this test can be used) for the duration of the COVID-19 declaration under Section 564(b)(1) of the Act, 21 U.S.C. section 360bbb-3(b)(1), unless the authorization is terminated  or revoked.  Performed at [facility], [address] [place], [place] [address]     Labs: CBC: Recent Labs  Lab 08/07/22 1125 08/07/22 1422 08/08/22 0534 08/09/22 0544  WBC 18.3* 16.6* 21.3* 25.2*  NEUTROABS 15.9*  --   --   --   HGB 13.3 12.7* 12.1* 13.2  HCT 40.1 38.5* 36.8* 39.0  MCV 89.3 89.7 89.3 87.8  PLT 249 235 253 [id number]   Basic Metabolic Panel: Recent Labs  Lab 08/07/22 1125 08/08/22 0531 08/08/22 0534 08/09/22 0544  NA 134*  --  130* 133*  K 3.8  --  3.3* 4.0  CL 104  --  102 106  CO2 24  --  20* 21*  GLUCOSE 117*  --  205* 165*  BUN 14  --  18 23*  CREATININE 0.77  --  0.74 0.85  CALCIUM 8.8*  --  8.6* 9.3  MG  --  1.8  --   --    Liver Function Tests: Recent Labs  Lab 08/08/22 0534  AST 24  ALT 20  ALKPHOS 80  BILITOT 0.9  PROT 7.4  ALBUMIN 3.5   CBG: No results for input(s): "GLUCAP" in the last 168 hours.  Discharge time spent: greater than 30 minutes.  Signed: Collier Bullock, MD [facility] 08/09/2022
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
## 2022-08-09 NOTE — Progress Notes (Signed)
Discharge orders. Patient leaving via car with wife. AVS printed and reviewed. All questions answered. All belongings gathered.

## 2022-09-10 ENCOUNTER — Other Ambulatory Visit: Payer: Self-pay | Admitting: Physician Assistant

## 2022-09-10 DIAGNOSIS — G8929 Other chronic pain: Secondary | ICD-10-CM

## 2022-09-10 DIAGNOSIS — R200 Anesthesia of skin: Secondary | ICD-10-CM

## 2022-09-15 ENCOUNTER — Ambulatory Visit
Admission: EM | Admit: 2022-09-15 | Discharge: 2022-09-15 | Disposition: A | Discharge status: Home / Self Care | Payer: Self-pay | Attending: Physician Assistant | Admitting: Physician Assistant

## 2022-09-15 ENCOUNTER — Ambulatory Visit (INDEPENDENT_AMBULATORY_CARE_PROVIDER_SITE_OTHER): Payer: Self-pay

## 2022-09-15 DIAGNOSIS — R0602 Shortness of breath: Secondary | ICD-10-CM

## 2022-09-15 DIAGNOSIS — J302 Other seasonal allergic rhinitis: Secondary | ICD-10-CM

## 2022-09-15 DIAGNOSIS — J320 Chronic maxillary sinusitis: Secondary | ICD-10-CM

## 2022-09-15 DIAGNOSIS — J449 Chronic obstructive pulmonary disease, unspecified: Secondary | ICD-10-CM

## 2022-09-15 DIAGNOSIS — J680 Bronchitis and pneumonitis due to chemicals, gases, fumes and vapors: Secondary | ICD-10-CM

## 2022-09-15 MED ORDER — PREDNISONE 20 MG PO TABS: 40.0000 mg | ORAL_TABLET | Freq: Every day | ORAL | 0 refills | Status: AC

## 2022-09-15 MED ORDER — PROMETHAZINE-DM 6.25-15 MG/5ML PO SYRP: 5.0000 mL | ORAL_SOLUTION | Freq: Four times a day (QID) | ORAL | 0 refills | Status: DC | PRN

## 2022-09-15 MED ORDER — AZITHROMYCIN 250 MG PO TABS: ORAL_TABLET | ORAL | 0 refills | Status: DC

## 2022-09-15 MED ORDER — MONTELUKAST SODIUM 10 MG PO TABS: 10.0000 mg | ORAL_TABLET | Freq: Every day | ORAL | 1 refills | Status: AC

## 2022-09-15 NOTE — ED Provider Notes (Signed)
[facility]    CSN: [id number] Arrival date & time: 09/15/22  0930      History   Chief Complaint Chief Complaint  Patient presents with   Cough    HPI Richard Ferguson is a 47 y.o. male.   Patient is a 47 year old male who presents with chief complaint of sinus pressure and congestion as well as chest congestion and cough that been ongoing for about 3 days.  Patient has a history of COPD but is not on oxygen at home.  Patient does use Advair at home and has a rescue inhaler as needed.  Patient was recently hospitalized for IV chemical pneumonitis and COPD exacerbation March 16 through 18.  Patient states he has symptoms did improve after that hospitalization.  Patient reports he does smoke but has dropped down from 3 packs a day to 1.  He has followed with pulmonology at the [facility].  Patient has been taking Mucinex, Robitussin and albuterol cold and sinus at home.  Patient denies any fever does report some green/brownish mucus production.    Past Medical History:  Diagnosis Date   Back injuries    Closed fracture of capitate bone of right wrist 12/21/2018   Closed fracture of left distal radius 12/21/2018   Closed fracture of right distal radius and ulna, initial encounter 12/21/2018   Closed fracture of triquetrum of right wrist 12/21/2018   Closed transverse fracture of waist of scaphoid, right, initial encounter 12/21/2018   COPD (chronic obstructive pulmonary disease)    Dyspnea    Hypertension    Laceration of left leg 12/21/2018   Multiple trauma 12/23/2018   Open nondisp fracture of base of third metacarpal bone of right hand 12/21/2018   Open nondisplaced fracture of base of fourth metacarpal bone of right hand 12/21/2018   Pelvic fracture 12/17/2018   Sleep apnea     Patient Active Problem List   Diagnosis Date Noted   Obesity, Class III, BMI 40-49.9 (morbid obesity) 08/09/2022   Chemical pneumonitis 08/07/2022   Acute respiratory failure  with hypoxia 08/07/2022   Class 2 obesity due to excess calories without serious comorbidity with body mass index (BMI) of 38.0 to 38.9 in adult    Essential hypertension    COPD (chronic obstructive pulmonary disease) 12/21/2018   Tobacco use disorder 12/21/2018   Primary insomnia 12/26/2015   Type IV hyperlipidemia 07/11/2015   Trigger finger of right thumb 04/30/2015   Bilateral carpal tunnel syndrome 09/17/2014   Gastro-esophageal reflux disease without esophagitis 03/21/2014   Hyperlipidemia 03/21/2014   Sleep apnea 03/21/2014   Chronic back pain 10/11/2013   Spondylolysis of lumbar region 10/11/2013    Past Surgical History:  Procedure Laterality Date   CARPAL TUNNEL RELEASE     OPEN REDUCTION INTERNAL FIXATION (ORIF) DISTAL RADIAL FRACTURE Bilateral 12/18/2018   Procedure: OPEN REDUCTION INTERNAL FIXATION (ORIF) DISTAL RADIAL FRACTURE;  Surgeon: Roby Lofts, MD;  Location: [facility];  Service: Orthopedics;  Laterality: Bilateral;  laceration repair to left leg   ORIF PELVIC FRACTURE N/A 12/18/2018   Procedure: OPEN REDUCTION INTERNAL FIXATION (ORIF) PELVIC FRACTURE;  Surgeon: Roby Lofts, MD;  Location: [facility];  Service: Orthopedics;  Laterality: N/A;       Home Medications    Prior to Admission medications   Medication Sig Start Date End Date Taking? Authorizing Provider  acetaminophen (TYLENOL) 500 MG tablet Take 1-2 tablets (500-1,000 mg total) by mouth 4 (four) times daily -  with meals  and at bedtime. 12/26/18  Yes Love, Evlyn Kanner, PA-C  albuterol (VENTOLIN HFA) 108 (90 Base) MCG/ACT inhaler Inhale into the lungs. 08/26/21  Yes [provider]  amLODipine (NORVASC) 10 MG tablet Take 1 tablet by mouth daily. 06/10/21  Yes [provider]  azithromycin (ZITHROMAX Z-PAK) 250 MG tablet Take 2 tablets by mouth the first day followed by one tablet daily for next 4 days. 09/15/22  Yes Candis Schatz, PA-C  FLUoxetine (PROZAC) 20 MG capsule Take by mouth.  07/05/22  Yes [provider]  gabapentin (NEURONTIN) 400 MG capsule Take 1 capsule (400 mg total) by mouth 3 (three) times daily. Patient taking differently: Take 100 mg by mouth at bedtime. 12/26/18  Yes Love, Evlyn Kanner, PA-C  hydrochlorothiazide (HYDRODIURIL) 25 MG tablet  06/10/21  Yes [provider]  ibuprofen (ADVIL) 200 MG tablet Take 200 mg by mouth every 6 (six) hours as needed for fever, headache, mild pain, moderate pain or cramping.   Yes [provider]  lipase/protease/amylase (CREON) 36000 UNITS CPEP capsule Take 1 capsules with the first bite of each meal and 1 capsule with the first bite of each snack 08/03/22  Yes Vanga, Loel Dubonnet, MD  losartan (COZAAR) 100 MG tablet Take by mouth. 07/09/20  Yes [provider]  montelukast (SINGULAIR) 10 MG tablet Take 1 tablet (10 mg total) by mouth at bedtime. 09/15/22  Yes Candis Schatz, PA-C  rosuvastatin (CRESTOR) 5 MG tablet Take 5 mg by mouth daily.   Yes [provider]  Monte Fantasia INHUB 250-50 MCG/ACT AEPB Inhale into the lungs. 08/26/21  Yes [provider]  methylPREDNISolone (MEDROL DOSEPAK) 4 MG TBPK tablet Follow package instructions 08/09/22   Agbata, Tochukwu, MD  ondansetron (ZOFRAN-ODT) 8 MG disintegrating tablet Take 1 tablet (8 mg total) by mouth every 8 (eight) hours as needed for nausea or vomiting. 07/22/22   Becky Augusta, NP  pantoprazole (PROTONIX) 40 MG tablet Take 1 tablet (40 mg total) by mouth daily. 10/02/21 08/07/22  Shirlee Latch, PA-C    Family History Family History  Problem Relation Age of Onset   Hypertension Mother    Hypertension Father    Renal Disease Father    Hypertension Sister    Hypertension Brother     Social History Social History   Tobacco Use   Smoking status: Every Day    Packs/day: 2    Types: Cigarettes   Smokeless tobacco: Never  Vaping Use   Vaping Use: Never used  Substance Use Topics   Alcohol use: Yes    Comment: Social    Drug use: No     Allergies   Naproxen sodium   Review of Systems Review of Systems   Physical Exam Triage Vital Signs ED Triage Vitals  Enc Vitals Group     BP 09/15/22 1015 131/87     Pulse Rate 09/15/22 1015 97     Resp --      Temp 09/15/22 1015 98.3 F (36.8 C)     Temp Source 09/15/22 1015 Oral     SpO2 09/15/22 1015 92 %     Weight 09/15/22 1013 (!) 305 lb (138.3 kg)     Height 09/15/22 1013  (1.803 m)     Head Circumference --      Peak Flow --      Pain Score 09/15/22 1013 6     Pain Loc --      Pain Edu? --  Excl. in GC? --    No data found.  Updated Vital Signs BP 131/87 (BP Location: Left Arm)   Pulse 97   Temp 98.3 F (36.8 C) (Oral)   Ht  (1.803 m)   Wt (!) 305 lb (138.3 kg)   SpO2 92%   BMI 42.54 kg/m   Visual Acuity Right Eye Distance:   Left Eye Distance:   Bilateral Distance:    Right Eye Near:   Left Eye Near:    Bilateral Near:     Physical Exam Constitutional:      Appearance: Normal appearance.  HENT:     Head: Normocephalic.     Right Ear: Tympanic membrane normal.     Left Ear: Tympanic membrane normal.     Nose: Congestion present.     Right Sinus: Maxillary sinus tenderness present. No frontal sinus tenderness.     Left Sinus: Maxillary sinus tenderness present. No frontal sinus tenderness.     Mouth/Throat:     Mouth: Mucous membranes are moist.     Comments: Whitish colored postnasal drainage and some mild posterior oropharynx erythema. Cardiovascular:     Rate and Rhythm: Normal rate and regular rhythm.     Heart sounds: Normal heart sounds.  Pulmonary:     Breath sounds: Rales (bilateral lower lobe) present.     Comments: Saturation 90-93% on room air.  Diminished breath sounds throughout. Mild increased WOB Musculoskeletal:        General: Normal range of motion.  Neurological:     General: No focal deficit present.     Mental Status: He is alert and oriented to person, place, and time.       UC Treatments / Results  Labs (all labs ordered are listed, but only abnormal results are displayed) Labs Reviewed - No data to display  EKG   Radiology DG Chest 2 View  Result Date: 09/15/2022 CLINICAL DATA:  Cough EXAM: CHEST - 2 VIEW COMPARISON:  08/07/2022 x-ray. CT angiogram 08/08/2022. Older exams as well FINDINGS: There is marked improvement in the bilateral parenchymal interstitial changes with some residual perihilar. No pneumothorax, effusion or edema. Normal cardiopericardial silhouette. Bilateral nipple jewelry. Apical pleural thickening. IMPRESSION: Marked improvement in the bilateral parenchymal and interstitial opacities. Some residual perihilar bilaterally. Recommend continued follow-up to confirm complete resolution. Electronically Signed   By: Karen Kays M.D.   On: 09/15/2022 10:55    Procedures Procedures (including critical care time)  Medications Ordered in UC Medications - No data to display  Initial Impression / Assessment and Plan / UC Course  I have reviewed the triage vital signs and the nursing notes.  Pertinent labs & imaging results that were available during my care of the patient were reviewed by me and considered in my medical decision making (see chart for details).    Patient is a 47 year old male with past medical history of COPD on Advair who presents with complaint of sinus congestion and pressure as well as cough congestion.  Patient reports lung function about 57%.  Patient with recent hospitalization with chemical pneumonitis and COPD exasperation in mid March.  Patient states though symptoms did improve.  He reports these symptoms are related to effects of seasonal allergies.  He reports that these typically evolve into a sinus infection for him.  Checks x-ray with marked improvement in setting of his recent pneumonitis.  Still with some perihilar markings.  Will give patient prescription for azithromycin to help with any possible sinus  infection and his shortness of breath with productive cough in the setting of his known lung disease.  Will have him monitor his home saturations closely.  Also have him follow-up with his pulmonologist and set him up chart message just to make him aware of everything.  Also give him a prescription for Singulair to add to his Advair.  Also recommend saline rinse or Nettie pot to help clear out his sinuses. Final Clinical Impressions(s) / UC Diagnoses   Final diagnoses:  Chronic obstructive pulmonary disease, unspecified COPD type  Chemical pneumonitis  Maxillary sinusitis, unspecified chronicity  SOB (shortness of breath)  Seasonal allergies     Discharge Instructions      -Chest x-ray showed improvement in pneumonitis but still some increased markings above your baseline. -Azithromycin: Take as directed on packaging -Singulair: 1 tablet daily for allergy symptoms -Would monitor your oxygen saturation at home.  She had a stable low 90s, would present to the ER -Follow-up with pulmonology -If symptoms worsen, including worsening shortness of breath or increased work of breathing, would present to the ER for further evaluation.     ED Prescriptions     Medication Sig Dispense Auth. Provider   montelukast (SINGULAIR) 10 MG tablet Take 1 tablet (10 mg total) by mouth at bedtime. 30 tablet Candis Schatz, PA-C   azithromycin (ZITHROMAX Z-PAK) 250 MG tablet Take 2 tablets by mouth the first day followed by one tablet daily for next 4 days. 6 tablet Candis Schatz, PA-C      PDMP not reviewed this encounter.   Candis Schatz, PA-C 09/15/22 1105

## 2022-09-15 NOTE — Discharge Instructions (Addendum)
-  Chest x-ray showed improvement in pneumonitis but still some increased markings above your baseline. -Prednisone:  40 mg (2 tabs) daily for 5 days -Azithromycin: Take as directed on packaging -Singulair: 1 tablet daily for allergy symptoms -Would monitor your oxygen saturation at home.  She had a stable low 90s, would present to the ER -Follow-up with pulmonology -If symptoms worsen, including worsening shortness of breath or increased work of breathing, would present to the ER for further evaluation.
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
## 2022-09-22 ENCOUNTER — Other Ambulatory Visit: Payer: Self-pay

## 2022-10-04 ENCOUNTER — Telehealth: Payer: Self-pay

## 2022-10-04 NOTE — Telephone Encounter (Signed)
Pt  wife Oren Herington  called to cancel procedure does not have insurance at this time unable to pay 3,000.00 out  of pocket will call back to reschedule

## 2022-10-04 NOTE — Telephone Encounter (Signed)
I have canceled the procedure with Selena Batten in [place]. Called patient to left him know procedure has been canceled

## 2022-10-07 ENCOUNTER — Ambulatory Visit: Admission: RE | Admit: 2022-10-07 | Payer: Self-pay | Source: Home / Self Care | Admitting: Gastroenterology

## 2022-10-07 HISTORY — DX: Sleep apnea, unspecified: G47.30

## 2022-10-07 HISTORY — DX: Dyspnea, unspecified: R06.00

## 2022-10-07 SURGERY — COLONOSCOPY WITH PROPOFOL
Anesthesia: Choice

## 2022-10-22 ENCOUNTER — Telehealth: Payer: Self-pay | Admitting: Gastroenterology

## 2022-10-22 NOTE — Telephone Encounter (Signed)
Pt wife cady left message that pt did not receive mail order prescription (creon) would like a prescpription called into [facility] [place]

## 2022-10-22 NOTE — Telephone Encounter (Signed)
Called patient and left a detail message stating if I sent it to the local pharmacy he would have to pay full price for the medication. He would need to call the patient assistance number and order a refill on the medication. Gave patient the number to call so a refill could be placed.

## 2022-12-22 ENCOUNTER — Ambulatory Visit
Admission: EM | Admit: 2022-12-22 | Discharge: 2022-12-22 | Disposition: A | Discharge status: Home / Self Care | Payer: Self-pay | Attending: Emergency Medicine | Admitting: Emergency Medicine

## 2022-12-22 DIAGNOSIS — S61212A Laceration without foreign body of right middle finger without damage to nail, initial encounter: Secondary | ICD-10-CM

## 2022-12-22 DIAGNOSIS — S61451A Open bite of right hand, initial encounter: Secondary | ICD-10-CM

## 2022-12-22 DIAGNOSIS — W540XXA Bitten by dog, initial encounter: Secondary | ICD-10-CM

## 2022-12-22 DIAGNOSIS — S61239A Puncture wound without foreign body of unspecified finger without damage to nail, initial encounter: Secondary | ICD-10-CM

## 2022-12-22 MED ORDER — AMOXICILLIN-POT CLAVULANATE 875-125 MG PO TABS: 1.0000 | ORAL_TABLET | Freq: Two times a day (BID) | ORAL | 0 refills | Status: AC

## 2022-12-22 NOTE — ED Provider Notes (Signed)
[facility]    CSN: [id number] Arrival date & time: 12/22/22  1256      History   Chief Complaint Chief Complaint  Patient presents with   Animal Bite    HPI Richard Ferguson is a 47 y.o. male.   HPI  47 year old male with a past medical history significant for essential hypertension, class II diabetes, COPD, and hyperlipidemia presents for evaluation of wounds he sustained from a dog bite to his right hand.  It was his dog who bit him last night.  The patient has since had his dog put down and reports that the dog was up-to-date on his vaccines.  He sustained a laceration to the webspace of the right second and third fingers as well as a small puncture wound to the distal aspect of the right fourth finger.  No active bleeding.  Patient reports that his last tetanus shot was in 2020.  He came in because he wants to make sure he does not get an infection from the dog bites.  Past Medical History:  Diagnosis Date   Back injuries    Closed fracture of capitate bone of right wrist 12/21/2018   Closed fracture of left distal radius 12/21/2018   Closed fracture of right distal radius and ulna, initial encounter 12/21/2018   Closed fracture of triquetrum of right wrist 12/21/2018   Closed transverse fracture of waist of scaphoid, right, initial encounter 12/21/2018   COPD (chronic obstructive pulmonary disease) (HCC)    Dyspnea    Hypertension    Laceration of left leg 12/21/2018   Multiple trauma 12/23/2018   Open nondisp fracture of base of third metacarpal bone of right hand 12/21/2018   Open nondisplaced fracture of base of fourth metacarpal bone of right hand 12/21/2018   Pelvic fracture (HCC) 12/17/2018   Sleep apnea     Patient Active Problem List   Diagnosis Date Noted   Obesity, Class III, BMI 40-49.9 (morbid obesity) (HCC) 08/09/2022   Chemical pneumonitis (HCC) 08/07/2022   Acute respiratory failure with hypoxia (HCC) 08/07/2022   Class 2 obesity due to  excess calories without serious comorbidity with body mass index (BMI) of 38.0 to 38.9 in adult    Essential hypertension    COPD (chronic obstructive pulmonary disease) (HCC) 12/21/2018   Tobacco use disorder 12/21/2018   Primary insomnia 12/26/2015   Type IV hyperlipidemia 07/11/2015   Trigger finger of right thumb 04/30/2015   Bilateral carpal tunnel syndrome 09/17/2014   Gastro-esophageal reflux disease without esophagitis 03/21/2014   Hyperlipidemia 03/21/2014   Sleep apnea 03/21/2014   Chronic back pain 10/11/2013   Spondylolysis of lumbar region 10/11/2013    Past Surgical History:  Procedure Laterality Date   CARPAL TUNNEL RELEASE     OPEN REDUCTION INTERNAL FIXATION (ORIF) DISTAL RADIAL FRACTURE Bilateral 12/18/2018   Procedure: OPEN REDUCTION INTERNAL FIXATION (ORIF) DISTAL RADIAL FRACTURE;  Surgeon: Roby Lofts, MD;  Location: [facility] OR;  Service: Orthopedics;  Laterality: Bilateral;  laceration repair to left leg   ORIF PELVIC FRACTURE N/A 12/18/2018   Procedure: OPEN REDUCTION INTERNAL FIXATION (ORIF) PELVIC FRACTURE;  Surgeon: Roby Lofts, MD;  Location: [facility] OR;  Service: Orthopedics;  Laterality: N/A;       Home Medications    Prior to Admission medications   Medication Sig Start Date End Date Taking? Authorizing Provider  acetaminophen (TYLENOL) 500 MG tablet Take 1-2 tablets (500-1,000 mg total) by mouth 4 (four) times daily -  with meals and  at bedtime. 12/26/18  Yes Love, Evlyn Kanner, PA-C  albuterol (VENTOLIN HFA) 108 (90 Base) MCG/ACT inhaler Inhale into the lungs. 08/26/21  Yes [provider]  amLODipine (NORVASC) 10 MG tablet Take 1 tablet by mouth daily. 06/10/21  Yes [provider]  amoxicillin-clavulanate (AUGMENTIN) 875-125 MG tablet Take 1 tablet by mouth every 12 (twelve) hours for 7 days. 12/22/22 12/29/22 Yes Becky Augusta, NP  FLUoxetine (PROZAC) 20 MG capsule Take by mouth. 07/05/22  Yes [provider]  gabapentin (NEURONTIN) 400  MG capsule Take 1 capsule (400 mg total) by mouth 3 (three) times daily. Patient taking differently: Take 100 mg by mouth at bedtime. 12/26/18  Yes Love, Evlyn Kanner, PA-C  hydrochlorothiazide (HYDRODIURIL) 25 MG tablet  06/10/21  Yes [provider]  ibuprofen (ADVIL) 200 MG tablet Take 200 mg by mouth every 6 (six) hours as needed for fever, headache, mild pain, moderate pain or cramping.   Yes [provider]  lipase/protease/amylase (CREON) 36000 UNITS CPEP capsule Take 1 capsules with the first bite of each meal and 1 capsule with the first bite of each snack 08/03/22  Yes Vanga, Loel Dubonnet, MD  losartan (COZAAR) 100 MG tablet Take by mouth. 07/09/20  Yes [provider]  montelukast (SINGULAIR) 10 MG tablet Take 1 tablet (10 mg total) by mouth at bedtime. 09/15/22  Yes Candis Schatz, PA-C  rosuvastatin (CRESTOR) 5 MG tablet Take 5 mg by mouth daily.   Yes [provider]  Monte Fantasia INHUB 250-50 MCG/ACT AEPB Inhale into the lungs. 08/26/21  Yes [provider]  pantoprazole (PROTONIX) 40 MG tablet Take 1 tablet (40 mg total) by mouth daily. 10/02/21 08/07/22  Shirlee Latch, PA-C    Family History Family History  Problem Relation Age of Onset   Hypertension Mother    Hypertension Father    Renal Disease Father    Hypertension Sister    Hypertension Brother     Social History Social History   Tobacco Use   Smoking status: Every Day    Current packs/day: 2.00    Types: Cigarettes   Smokeless tobacco: Never  Vaping Use   Vaping status: Never Used  Substance Use Topics   Alcohol use: Yes    Comment: Social   Drug use: No     Allergies   Naproxen sodium   Review of Systems Review of Systems  Constitutional:  Negative for fever.  Skin:  Positive for color change and wound.     Physical Exam Triage Vital Signs ED Triage Vitals  Encounter Vitals Group     BP 12/22/22 1312 130/78     Systolic BP Percentile --      Diastolic BP  Percentile --      Pulse Rate 12/22/22 1312 88     Resp 12/22/22 1312 18     Temp 12/22/22 1312 98.4 F (36.9 C)     Temp Source 12/22/22 1312 Oral     SpO2 12/22/22 1312 96 %     Weight 12/22/22 1311 (!) 310 lb (140.6 kg)     Height --      Head Circumference --      Peak Flow --      Pain Score 12/22/22 1311 5     Pain Loc --      Pain Education --      Exclude from Growth Chart --    No data found.  Updated Vital Signs BP 130/78 (BP Location: Left Arm)  Pulse 88   Temp 98.4 F (36.9 C) (Oral)   Resp 18   Wt (!) 310 lb (140.6 kg)   SpO2 96%   BMI 43.24 kg/m   Visual Acuity Right Eye Distance:   Left Eye Distance:   Bilateral Distance:    Right Eye Near:   Left Eye Near:    Bilateral Near:     Physical Exam Vitals and nursing note reviewed.  Constitutional:      Appearance: Normal appearance. He is not ill-appearing.  HENT:     Head: Normocephalic and atraumatic.  Musculoskeletal:        General: Swelling, tenderness and signs of injury present. No deformity. Normal range of motion.  Skin:    General: Skin is warm and dry.     Capillary Refill: Capillary refill takes less than 2 seconds.     Findings: Bruising present. No erythema.  Neurological:     General: No focal deficit present.     Mental Status: He is alert and oriented to person, place, and time.      UC Treatments / Results  Labs (all labs ordered are listed, but only abnormal results are displayed) Labs Reviewed - No data to display  EKG   Radiology No results found.  Procedures Procedures (including critical care time)  Medications Ordered in UC Medications - No data to display  Initial Impression / Assessment and Plan / UC Course  I have reviewed the triage vital signs and the nursing notes.  Pertinent labs & imaging results that were available during my care of the patient were reviewed by me and considered in my medical decision making (see chart for details).   Patient  is a pleasant, nontoxic-appearing 47 year old male presenting for evaluation of injury sustained to his right hand as result of a dog bite.  As you can see in image above, there is a superficial laceration to the webspace of the right third finger as well as a small puncture wound in 2 separate locations on the right second finger.  There is a small puncture wound to the distal aspect of the right fourth finger as well.  The wounds and do not appear to be contaminated and he has full range of motion and sensation in his fingers.  I will have staff clean out the wounds and apply bacitracin and dry dressings to them.  I will start the patient on Augmentin 875 twice daily for 7 days and have him soak his wounds in warm water and Epsom salts twice daily.  After he soaks through wounds he should apply bacitracin to the wounds until they have either formed a scab or healed.  He should keep them covered until a scab has formed.  After scab is formed he can leave them open to air when at home and cover them when he is in public.  ER precautions  reviewed.  Final Clinical Impressions(s) / UC Diagnoses   Final diagnoses:  Laceration of right middle finger without foreign body without damage to nail, initial encounter  Puncture wound of finger, initial encounter  Dog bite of right hand, initial encounter     Discharge Instructions      Keep the wounds on your right hand clean and dry.  I do want you to soak your hand in warm water and Epsom salts twice daily for 15 minutes.  After you have soak your hand in which you do pat it dry and pack bacitracin ointment into the laceration  on your right middle finger.  I want to do this until the wound has healed and a scab has formed.  Keep all of your injuries covered with dressing until after scab is formed.  Once a scab is formed you can leave it open to air when at home and cover them with a dry dressing, such as a Band-Aid, when out in public.  If you develop  any increased redness, swelling, pus drainage, red streaks going up your arm, or fever you need to go to the ER for evaluation.     ED Prescriptions     Medication Sig Dispense Auth. Provider   amoxicillin-clavulanate (AUGMENTIN) 875-125 MG tablet Take 1 tablet by mouth every 12 (twelve) hours for 7 days. 14 tablet Becky Augusta, NP      PDMP not reviewed this encounter.   Becky Augusta, NP 12/22/22 1326

## 2022-12-22 NOTE — Discharge Instructions (Addendum)
Keep the wounds on your right hand clean and dry.  I do want you to soak your hand in warm water and Epsom salts twice daily for 15 minutes.  After you have soak your hand in which you do pat it dry and pack bacitracin ointment into the laceration on your right middle finger.  I want to do this until the wound has healed and a scab has formed.  Keep all of your injuries covered with dressing until after scab is formed.  Once a scab is formed you can leave it open to air when at home and cover them with a dry dressing, such as a Band-Aid, when out in public.  If you develop any increased redness, swelling, pus drainage, red streaks going up your arm, or fever you need to go to the ER for evaluation.

## 2022-12-22 NOTE — ED Triage Notes (Signed)
Dog bite on right hand. Dog was UTD on vaccines. Dog was put down last last night

## 2023-03-29 ENCOUNTER — Telehealth: Payer: Self-pay

## 2023-03-29 NOTE — Telephone Encounter (Signed)
Patient creon patient assistance is expiring 05/24/2023. If patient is still taking the medication we will need to fill out a new application and patient will need to come by and sign the application form.  Patient is also due for a follow up appointment with Dr. Allegra Lai or Inetta Fermo.   Called patient and patient states he is still taking the medication and made appointment for patient on 05/03/2023 with tina. Sent reminder in the mail. Patient will sign application form at appointment

## 2023-04-28 ENCOUNTER — Ambulatory Visit
Admission: EM | Admit: 2023-04-28 | Discharge: 2023-04-28 | Disposition: A | Discharge status: Home / Self Care | Payer: Self-pay | Attending: Emergency Medicine | Admitting: Emergency Medicine

## 2023-04-28 ENCOUNTER — Ambulatory Visit: Payer: Self-pay

## 2023-04-28 DIAGNOSIS — J189 Pneumonia, unspecified organism: Secondary | ICD-10-CM

## 2023-04-28 MED ORDER — PREDNISONE 20 MG PO TABS: 60.0000 mg | ORAL_TABLET | Freq: Every day | ORAL | 0 refills | Status: AC

## 2023-04-28 MED ORDER — IPRATROPIUM-ALBUTEROL 0.5-2.5 (3) MG/3ML IN SOLN
3.0000 mL | Freq: Once | RESPIRATORY_TRACT | Status: AC
  Administered 2023-04-28: 3 mL via RESPIRATORY_TRACT

## 2023-04-28 MED ORDER — PROMETHAZINE-DM 6.25-15 MG/5ML PO SYRP: 5.0000 mL | ORAL_SOLUTION | Freq: Four times a day (QID) | ORAL | 0 refills | Status: DC | PRN

## 2023-04-28 MED ORDER — BENZONATATE 100 MG PO CAPS: 200.0000 mg | ORAL_CAPSULE | Freq: Three times a day (TID) | ORAL | 0 refills | Status: DC

## 2023-04-28 MED ORDER — DEXAMETHASONE SODIUM PHOSPHATE 10 MG/ML IJ SOLN
10.0000 mg | Freq: Once | INTRAMUSCULAR | Status: AC
  Administered 2023-04-28: 10 mg via INTRAMUSCULAR

## 2023-04-28 MED ORDER — ALBUTEROL SULFATE (2.5 MG/3ML) 0.083% IN NEBU
2.5000 mg | INHALATION_SOLUTION | Freq: Once | RESPIRATORY_TRACT | Status: AC
  Administered 2023-04-28: 2.5 mg via RESPIRATORY_TRACT

## 2023-04-28 MED ORDER — LEVOFLOXACIN 500 MG PO TABS: 500.0000 mg | ORAL_TABLET | Freq: Every day | ORAL | 0 refills | Status: DC

## 2023-04-28 NOTE — ED Triage Notes (Signed)
Pt c/o continued symptoms -Sob, chest burning x22month  Pt states that he had pneumonia and bronchitis a month ago.   Pt was told to come back today for a recheck.  Pt has continued burning in the chest and throat.

## 2023-04-28 NOTE — Discharge Instructions (Addendum)
So the radiologist sees worsening focal area is concerning for acute infection and your right midlung and also your left midlung.  Take the Levaquin 500 mg once daily for 7 days for treatment these pneumonias.  Continue to take your Advair twice daily and use your albuterol inhaler, 1 to 2 puffs every 4-6 hours, as needed for shortness of breath or wheezing.  I Musko started on prednisone to help decrease pulmonary inflammation, start that tomorrow morning at breakfast time.  She will take it each morning at breakfast for 5 days.  Use the Tessalon Perles every 8 hours during the day as needed for cough and use the Promethazine DM cough syrup at bedtime as needed for cough and congestion.  If you have any worsening shortness of breath, feels that you cannot catch her breath, you are unable to speak in full sentences, or her lips begin turning blue you need to call [phone] echo
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
## 2023-04-28 NOTE — ED Notes (Signed)
Checked patient O2 in lobby. 96%.

## 2023-05-02 NOTE — Progress Notes (Unsigned)
Celso Amy, PA-C [address]  [address]  [place], [place] [address]  Main: [phone]  Fax: [phone]   Primary Care Physician: Marina Goodell, MD  Primary Gastroenterologist:  Celso Amy, PA-C / Dr. Lannette Donath    CC: F/U EPI; Creon; Chronic Diarrhea  HPI: NIKASH BENSE is a 47 y.o. male, established patient Dr. Allegra Lai, returns for 46-month follow-up of chronic diarrhea, EPI, and GERD.  07/2022, he was given samples of Creon which helped and was continued on medication.  He needs refill of Creon 36,000 lipase units and medication assistance.  EGD - Ordered, but Not done.  Needs EGD to screen for Barrett's given long history of GERD.  Colonoscopy - Ordered, but Not done.  He is overdue for his for screening colonoscopy.  07/2022: Stool studies: GI pathogen panel mother alert and Sapa virus.  All other infections negative.  07/2022 Labs: Negative H. pylori breath test.  Normal hemoglobin 13.3. 03/2023: Hemoglobin 12.8 12/2022: low vitamin B12.  Normal folate and ferritin.  Current Outpatient Medications  Medication Sig Dispense Refill   acetaminophen (TYLENOL) 500 MG tablet Take 1-2 tablets (500-1,000 mg total) by mouth 4 (four) times daily -  with meals and at bedtime. 30 tablet 0   albuterol (VENTOLIN HFA) 108 (90 Base) MCG/ACT inhaler Inhale into the lungs.     amLODipine (NORVASC) 10 MG tablet Take 1 tablet by mouth daily.     benzonatate (TESSALON) 100 MG capsule Take 2 capsules (200 mg total) by mouth every 8 (eight) hours. 21 capsule 0   FLUoxetine (PROZAC) 20 MG capsule Take by mouth.     gabapentin (NEURONTIN) 400 MG capsule Take 1 capsule (400 mg total) by mouth 3 (three) times daily. (Patient taking differently: Take 100 mg by mouth at bedtime.) 90 capsule 1   hydrochlorothiazide (HYDRODIURIL) 25 MG tablet      ibuprofen (ADVIL) 200 MG tablet Take 200 mg by mouth every 6 (six) hours as needed for fever, headache, mild pain, moderate pain or cramping.      levofloxacin (LEVAQUIN) 500 MG tablet Take 1 tablet (500 mg total) by mouth daily. 7 tablet 0   lipase/protease/amylase (CREON) 36000 UNITS CPEP capsule Take 1 capsules with the first bite of each meal and 1 capsule with the first bite of each snack 450 capsule 3   losartan (COZAAR) 100 MG tablet Take by mouth.     montelukast (SINGULAIR) 10 MG tablet Take 1 tablet (10 mg total) by mouth at bedtime. 30 tablet 1   pantoprazole (PROTONIX) 40 MG tablet Take 1 tablet (40 mg total) by mouth daily. 30 tablet 3   predniSONE (DELTASONE) 20 MG tablet Take 3 tablets (60 mg total) by mouth daily with breakfast for 5 days. 3 tablets daily for 5 days. 15 tablet 0   promethazine-dextromethorphan (PROMETHAZINE-DM) 6.25-15 MG/5ML syrup Take 5 mLs by mouth 4 (four) times daily as needed. 118 mL 0   rosuvastatin (CRESTOR) 5 MG tablet Take 5 mg by mouth daily.     WIXELA INHUB 250-50 MCG/ACT AEPB Inhale into the lungs.     No current facility-administered medications for this visit.    Allergies as of 05/03/2023 - Review Complete 04/28/2023  Allergen Reaction Noted   Naproxen sodium Hives 10/23/2012    Past Medical History:  Diagnosis Date   Back injuries    Closed fracture of capitate bone of right wrist 12/21/2018   Closed fracture of left distal radius 12/21/2018   Closed  fracture of right distal radius and ulna, initial encounter 12/21/2018   Closed fracture of triquetrum of right wrist 12/21/2018   Closed transverse fracture of waist of scaphoid, right, initial encounter 12/21/2018   COPD (chronic obstructive pulmonary disease) (HCC)    Dyspnea    Hypertension    Laceration of left leg 12/21/2018   Multiple trauma 12/23/2018   Open nondisp fracture of base of third metacarpal bone of right hand 12/21/2018   Open nondisplaced fracture of base of fourth metacarpal bone of right hand 12/21/2018   Pelvic fracture (HCC) 12/17/2018   Sleep apnea     Past Surgical History:  Procedure Laterality  Date   CARPAL TUNNEL RELEASE     OPEN REDUCTION INTERNAL FIXATION (ORIF) DISTAL RADIAL FRACTURE Bilateral 12/18/2018   Procedure: OPEN REDUCTION INTERNAL FIXATION (ORIF) DISTAL RADIAL FRACTURE;  Surgeon: Roby Lofts, MD;  Location: [facility] OR;  Service: Orthopedics;  Laterality: Bilateral;  laceration repair to left leg   ORIF PELVIC FRACTURE N/A 12/18/2018   Procedure: OPEN REDUCTION INTERNAL FIXATION (ORIF) PELVIC FRACTURE;  Surgeon: Roby Lofts, MD;  Location: [facility] OR;  Service: Orthopedics;  Laterality: N/A;    Review of Systems:    All systems reviewed and negative except where noted in HPI.   Physical Examination:   There were no vitals taken for this visit.  General: Well-nourished, well-developed in no acute distress.  Lungs: Clear to auscultation bilaterally. Non-labored. Heart: Regular rate and rhythm, no murmurs rubs or gallops.  Abdomen: Bowel sounds are normal; Abdomen is Soft; No hepatosplenomegaly, masses or hernias;  No Abdominal Tenderness; No guarding or rebound tenderness. Neuro: Alert and oriented x 3.  Grossly intact.  Psych: Alert and cooperative, normal mood and affect.   Imaging Studies: DG Chest 2 View  Result Date: 04/28/2023 CLINICAL DATA:  Cough and shortness of breath. EXAM: CHEST - 2 VIEW COMPARISON:  X-ray 09/15/2022 and older. FINDINGS: Stable cardiopericardial silhouette. No pneumothorax or effusion. Diffuse interstitial changes identified with some more focal parenchymal opacity seen towards the lingula and right midlung. Additional areas of infiltrate. IMPRESSION: Increasing interstitial changes identified with more focal areas of opacity right midlung and lingula. Possible acute infiltrate. Recommend correlation with symptoms and short follow up. Additional CT workup as clinically appropriate. Electronically Signed   By: Karen Kays M.D.   On: 04/28/2023 12:12    Assessment and Plan:   DARRELD DURRELL is a 47 y.o. y/o male ***    Celso Amy,  PA-C  Follow up ***  BP check ***

## 2023-05-03 ENCOUNTER — Ambulatory Visit: Payer: Self-pay | Admitting: Physician Assistant

## 2023-05-03 ENCOUNTER — Encounter: Payer: Self-pay | Admitting: Physician Assistant

## 2023-05-03 VITALS — BP 128/83 | HR 81 | Temp 98.0°F | Ht 71.0 in | Wt 325.8 lb

## 2023-05-03 DIAGNOSIS — Z1211 Encounter for screening for malignant neoplasm of colon: Secondary | ICD-10-CM

## 2023-05-03 DIAGNOSIS — K8681 Exocrine pancreatic insufficiency: Secondary | ICD-10-CM

## 2023-05-03 DIAGNOSIS — K219 Gastro-esophageal reflux disease without esophagitis: Secondary | ICD-10-CM

## 2023-05-03 NOTE — Telephone Encounter (Signed)
Faxed the patient signed patient assistance form will wait on response from the company.

## 2023-05-09 NOTE — Telephone Encounter (Signed)
States his application is approved till 07/2023 so they will not process the application till January. She said to call back in January

## 2023-05-24 ENCOUNTER — Telehealth: Payer: Self-pay | Admitting: Physician Assistant

## 2023-05-24 NOTE — Telephone Encounter (Signed)
 The patient wife Eliezer) called in and left a voicemail requesting (Creon ) simples because they called the pharmacy they are closed until the end of the week. She stated that it will be another week before they can send some out. I called the patient back to let her know we got her message and I inform her that we are currently out of the (Creon ) simples at the moment. We will have to call the rep to get more simples and the nurse will contact them  when we get more simples.

## 2023-05-26 NOTE — Telephone Encounter (Signed)
 Pt called requesting creon samples 3 /12 capsule boxes were given to wife KATIE

## 2023-05-30 NOTE — Telephone Encounter (Signed)
 Was on hold 40 minutes to check the status will try again at a later time

## 2023-05-31 NOTE — Telephone Encounter (Signed)
 Was on hold 50 minutes to check on the application will try again later

## 2023-06-02 NOTE — Telephone Encounter (Signed)
 Patient has been approved till 05/30/2023 per [employer] and we will be receiving a fax

## 2023-06-22 ENCOUNTER — Ambulatory Visit: Payer: Self-pay

## 2023-08-29 ENCOUNTER — Telehealth: Payer: Self-pay | Admitting: Gastroenterology

## 2023-08-29 NOTE — Telephone Encounter (Signed)
 Patient's wife, Burlene Arnt, called to inquire about a refill for Creon. I informed her that it will be another week before the refill is available.

## 2023-08-29 NOTE — Telephone Encounter (Signed)
 Patient wife states he will come get the samples tomorrow place 3 bottles upfront

## 2023-08-29 NOTE — Telephone Encounter (Signed)
 The patient wife Richard Ferguson) called in to see if we had any Creon refill. They were told that it is going to be another week before they can get more.

## 2024-02-17 ENCOUNTER — Ambulatory Visit: Payer: Self-pay

## 2024-02-17 ENCOUNTER — Ambulatory Visit
Admission: EM | Admit: 2024-02-17 | Discharge: 2024-02-17 | Disposition: A | Discharge status: Home / Self Care | Payer: Self-pay

## 2024-02-17 DIAGNOSIS — J441 Chronic obstructive pulmonary disease with (acute) exacerbation: Secondary | ICD-10-CM | POA: Insufficient documentation

## 2024-02-17 DIAGNOSIS — F17210 Nicotine dependence, cigarettes, uncomplicated: Secondary | ICD-10-CM | POA: Insufficient documentation

## 2024-02-17 DIAGNOSIS — R0602 Shortness of breath: Secondary | ICD-10-CM | POA: Insufficient documentation

## 2024-02-17 DIAGNOSIS — R051 Acute cough: Secondary | ICD-10-CM | POA: Insufficient documentation

## 2024-02-17 DIAGNOSIS — E669 Obesity, unspecified: Secondary | ICD-10-CM | POA: Insufficient documentation

## 2024-02-17 DIAGNOSIS — I10 Essential (primary) hypertension: Secondary | ICD-10-CM | POA: Insufficient documentation

## 2024-02-17 LAB — SARS CORONAVIRUS 2 BY RT PCR: SARS Coronavirus 2 by RT PCR: NEGATIVE

## 2024-02-17 MED ORDER — PREDNISONE 20 MG PO TABS
40.0000 mg | ORAL_TABLET | Freq: Every day | ORAL | 0 refills | Status: AC
Start: 2024-02-17 — End: 2024-02-22

## 2024-02-17 MED ORDER — PROMETHAZINE-DM 6.25-15 MG/5ML PO SYRP: 5.0000 mL | ORAL_SOLUTION | Freq: Four times a day (QID) | ORAL | 0 refills | Status: AC | PRN

## 2024-02-17 MED ORDER — IPRATROPIUM-ALBUTEROL 0.5-2.5 (3) MG/3ML IN SOLN
3.0000 mL | Freq: Once | RESPIRATORY_TRACT | Status: AC
  Administered 2024-02-17: 3 mL via RESPIRATORY_TRACT

## 2024-02-17 MED ORDER — GUAIFENESIN-CODEINE 100-10 MG/5ML PO SOLN: 10.0000 mL | Freq: Every evening | ORAL | 0 refills | Status: AC | PRN

## 2024-02-17 MED ORDER — DOXYCYCLINE HYCLATE 100 MG PO CAPS
100.0000 mg | ORAL_CAPSULE | Freq: Two times a day (BID) | ORAL | 0 refills | Status: AC
Start: 2024-02-17 — End: 2024-02-24

## 2024-02-17 NOTE — ED Notes (Signed)
 Pt states that his temperature is high at 99.3 and asks for a covid test to be done.

## 2024-02-17 NOTE — ED Provider Notes (Signed)
 [facility]    CSN: [id number] Arrival date & time: 02/17/24  1733      History   Chief Complaint Chief Complaint  Patient presents with   Cough    HPI Richard Ferguson is a 48 y.o. male presenting for approximately 3 day history of nasal congestion, cough productive of foamy white sputum, chest burning/pain,  postnasal drainage and increased shortness of breath from baseline.  Patient has history of COPD, hypertension, obesity and tobacco abuse disorder.  He denies fever, ear pain, sinus pain, vomiting or diarrhea. He has been using his Wixela inhaler and albuterol  as needed.  He reports having to use his albuterol  inhaler more frequently than normal.  Patient also reports taking over-the-counter Robitussin for cough recently without any improvement in symptoms.  He denies any exposure to COVID or concern for that. He did have a negative home COVID test. Reports that his daughter is sick with similar symptoms right now.  No other complaints today.  HPI  Past Medical History:  Diagnosis Date   Back injuries    Closed fracture of capitate bone of right wrist 12/21/2018   Closed fracture of left distal radius 12/21/2018   Closed fracture of right distal radius and ulna, initial encounter 12/21/2018   Closed fracture of triquetrum of right wrist 12/21/2018   Closed transverse fracture of waist of scaphoid, right, initial encounter 12/21/2018   COPD (chronic obstructive pulmonary disease) (HCC)    Dyspnea    Hypertension    Laceration of left leg 12/21/2018   Multiple trauma 12/23/2018   Open nondisp fracture of base of third metacarpal bone of right hand 12/21/2018   Open nondisplaced fracture of base of fourth metacarpal bone of right hand 12/21/2018   Pelvic fracture (HCC) 12/17/2018   Sleep apnea     Patient Active Problem List   Diagnosis Date Noted   Obesity, Class III, BMI 40-49.9 (morbid obesity) 08/09/2022   Chemical pneumonitis (HCC) 08/07/2022   Acute  respiratory failure with hypoxia (HCC) 08/07/2022   Class 2 obesity due to excess calories without serious comorbidity with body mass index (BMI) of 38.0 to 38.9 in adult    Essential hypertension    COPD (chronic obstructive pulmonary disease) (HCC) 12/21/2018   Tobacco use disorder 12/21/2018   Primary insomnia 12/26/2015   Type IV hyperlipidemia 07/11/2015   Trigger finger of right thumb 04/30/2015   Bilateral carpal tunnel syndrome 09/17/2014   Gastro-esophageal reflux disease without esophagitis 03/21/2014   Hyperlipidemia 03/21/2014   Sleep apnea 03/21/2014   Chronic back pain 10/11/2013   Spondylolysis of lumbar region 10/11/2013    Past Surgical History:  Procedure Laterality Date   CARPAL TUNNEL RELEASE     OPEN REDUCTION INTERNAL FIXATION (ORIF) DISTAL RADIAL FRACTURE Bilateral 12/18/2018   Procedure: OPEN REDUCTION INTERNAL FIXATION (ORIF) DISTAL RADIAL FRACTURE;  Surgeon: Kendal Franky SQUIBB, MD;  Location: [facility] OR;  Service: Orthopedics;  Laterality: Bilateral;  laceration repair to left leg   ORIF PELVIC FRACTURE N/A 12/18/2018   Procedure: OPEN REDUCTION INTERNAL FIXATION (ORIF) PELVIC FRACTURE;  Surgeon: Kendal Franky SQUIBB, MD;  Location: [facility] OR;  Service: Orthopedics;  Laterality: N/A;       Home Medications    Prior to Admission medications   Medication Sig Start Date End Date Taking? Authorizing Provider  acetaminophen  (TYLENOL ) 500 MG tablet Take 1-2 tablets (500-1,000 mg total) by mouth 4 (four) times daily -  with meals and at bedtime. 12/26/18  Yes Love, Sharlet GORMAN,  PA-C  albuterol  (VENTOLIN  HFA) 108 (90 Base) MCG/ACT inhaler Inhale into the lungs. 08/26/21  Yes [provider]  amLODipine  (NORVASC ) 10 MG tablet Take 1 tablet by mouth daily. 06/10/21  Yes [provider]  doxycycline  (VIBRAMYCIN ) 100 MG capsule Take 1 capsule (100 mg total) by mouth 2 (two) times daily for 7 days. 02/17/24 02/24/24 Yes Arvis Jolan NOVAK, PA-C  gabapentin  (NEURONTIN ) 400 MG  capsule Take 1 capsule (400 mg total) by mouth 3 (three) times daily. Patient taking differently: Take 100 mg by mouth at bedtime. 12/26/18  Yes Love, Sharlet RAMAN, PA-C  guaiFENesin -codeine  100-10 MG/5ML syrup Take 10 mLs by mouth at bedtime as needed for cough. 02/17/24  Yes Arvis Jolan B, PA-C  hydrochlorothiazide (HYDRODIURIL) 25 MG tablet  06/10/21  Yes [provider]  ibuprofen  (ADVIL ) 200 MG tablet Take 200 mg by mouth every 6 (six) hours as needed for fever, headache, mild pain, moderate pain or cramping.   Yes [provider]  lipase/protease/amylase (CREON ) 36000 UNITS CPEP capsule Take 1 capsules with the first bite of each meal and 1 capsule with the first bite of each snack 08/03/22  Yes Vanga, Corinn Skiff, MD  losartan  (COZAAR ) 100 MG tablet Take by mouth. 07/09/20  Yes [provider]  montelukast  (SINGULAIR ) 10 MG tablet Take 1 tablet (10 mg total) by mouth at bedtime. 09/15/22  Yes Arloa Ozell BIRCH, PA-C  pramipexole (MIRAPEX) 0.25 MG tablet Take 0.25 mg by mouth 2 (two) times daily.   Yes [provider]  predniSONE  (DELTASONE ) 20 MG tablet Take 2 tablets (40 mg total) by mouth daily for 5 days. 02/17/24 02/22/24 Yes Arvis Jolan B, PA-C  promethazine -dextromethorphan (PROMETHAZINE -DM) 6.25-15 MG/5ML syrup Take 5 mLs by mouth 4 (four) times daily as needed. 02/17/24  Yes Arvis Jolan B, PA-C  rosuvastatin (CRESTOR) 5 MG tablet Take 5 mg by mouth daily.   Yes [provider]  NAPOLEON INHUB 250-50 MCG/ACT AEPB Inhale into the lungs. 08/26/21  Yes [provider]  FLUoxetine  (PROZAC ) 20 MG capsule Take by mouth. 07/05/22   [provider]  pantoprazole  (PROTONIX ) 40 MG tablet Take 1 tablet (40 mg total) by mouth daily. 10/02/21 08/07/22  Arvis Jolan NOVAK, PA-C    Family History Family History  Problem Relation Age of Onset   Hypertension Mother    Hypertension Father    Renal Disease Father    Hypertension Sister    Hypertension  Brother     Social History Social History   Tobacco Use   Smoking status: Every Day    Current packs/day: 2.00    Types: Cigarettes   Smokeless tobacco: Never  Vaping Use   Vaping status: Never Used  Substance Use Topics   Alcohol use: Yes    Comment: Social   Drug use: No     Allergies   Naproxen sodium   Review of Systems Review of Systems  Constitutional:  Positive for fatigue. Negative for fever.  HENT:  Positive for congestion, postnasal drip and rhinorrhea. Negative for ear pain, sinus pressure, sinus pain and sore throat.   Respiratory:  Positive for cough and shortness of breath.   Cardiovascular:  Positive for chest pain.  Gastrointestinal:  Negative for abdominal pain, diarrhea, nausea and vomiting.  Musculoskeletal:  Negative for myalgias.  Neurological:  Negative for weakness, light-headedness and headaches.  Hematological:  Negative for adenopathy.  Psychiatric/Behavioral:  Positive for sleep disturbance (due to cough).      Physical Exam Triage  Vital Signs  No data found.  Updated Vital Signs BP 107/70 (BP Location: Left Arm)   Pulse 100   Temp 99.3 F (37.4 C) (Oral)   Resp (!) 25   Wt 292 lb 8 oz (132.7 kg)   SpO2 96%   BMI 40.80 kg/m       Physical Exam Vitals and nursing note reviewed.  Constitutional:      General: He is not in acute distress.    Appearance: Normal appearance. He is well-developed. He is ill-appearing.  HENT:     Head: Normocephalic and atraumatic.     Nose: Congestion present.     Mouth/Throat:     Mouth: Mucous membranes are moist.     Pharynx: Oropharynx is clear.  Eyes:     General: No scleral icterus.    Conjunctiva/sclera: Conjunctivae normal.  Cardiovascular:     Rate and Rhythm: Regular rhythm. Tachycardia present.     Heart sounds: Normal heart sounds.  Pulmonary:     Effort: Pulmonary effort is normal. No respiratory distress.     Breath sounds: Wheezing (few scattered wheezes throughout with  diminished breath sounds throughout) present.  Musculoskeletal:     Cervical back: Neck supple.  Skin:    General: Skin is warm and dry.     Capillary Refill: Capillary refill takes less than 2 seconds.  Neurological:     General: No focal deficit present.     Mental Status: He is alert. Mental status is at baseline.     Motor: No weakness.     Coordination: Coordination normal.     Gait: Gait normal.  Psychiatric:        Mood and Affect: Mood normal.        Behavior: Behavior normal.      UC Treatments / Results  Labs (all labs ordered are listed, but only abnormal results are displayed) Labs Reviewed  SARS CORONAVIRUS 2 BY RT PCR    EKG   Radiology No results found.  Procedures Procedures (including critical care time)  Medications Ordered in UC Medications  ipratropium-albuterol  (DUONEB) 0.5-2.5 (3) MG/3ML nebulizer solution 3 mL (3 mLs Nebulization Given 02/17/24 1818)    Initial Impression / Assessment and Plan / UC Course  I have reviewed the triage vital signs and the nursing notes.  Pertinent labs & imaging results that were available during my care of the patient were reviewed by me and considered in my medical decision making (see chart for details).  48 year old male with history of COPD and tobacco abuse presents for approximately 3 day history of nasal congestion, cough that is productive of foamy white sputum and increased shortness of breath from baseline.  Has been taking OTC meds without improvement in symptoms.  He is not sure if he has any allergies or not.  He is presently afebrile. Ill appearing and coughs frequently. Nasal congestion present as well as few wheezes heard throughout but mostly diminished lung sounds throughout.  No respiratory distress.  Speaking in full and clear sentences.    COVID test negative.   Patient given duoneb. O2 went up to 96%.  Treating at this time with doxycycline , prednisone  and will try Promethazine  DM, and  cheratussin (bedtime use) at this time.  Also advised over-the-counter nasal spray, plenty rest and fluids.  Reviewed following up with his PCP.  Advised to continue at home breathing treatments follow-up here as needed.  ER precautions reviewed.   Final Clinical Impressions(s) / UC Diagnoses  Final diagnoses:  Shortness of breath  COPD exacerbation (HCC)  Acute cough     Discharge Instructions          -Negative COVID -COPD exacerbation - I sent doxycycline  and prednisone . - Take Promethazine  DM during day for cough and cheratussin at bedtime if needed for cough keeping you awake at night. - Increase rest and fluids.  Use your inhalers and nebulizers as well for any shortness of breath. - Follow-up here if fever, worsening symptoms including increased breathing difficulty or if you are not feeling better in the next several days.        ED Prescriptions     Medication Sig Dispense Auth. Provider   doxycycline  (VIBRAMYCIN ) 100 MG capsule Take 1 capsule (100 mg total) by mouth 2 (two) times daily for 7 days. 14 capsule Arvis Huxley B, PA-C   promethazine -dextromethorphan (PROMETHAZINE -DM) 6.25-15 MG/5ML syrup Take 5 mLs by mouth 4 (four) times daily as needed. 118 mL Arvis Huxley B, PA-C   guaiFENesin -codeine  100-10 MG/5ML syrup Take 10 mLs by mouth at bedtime as needed for cough. 120 mL Arvis Huxley B, PA-C   predniSONE  (DELTASONE ) 20 MG tablet Take 2 tablets (40 mg total) by mouth daily for 5 days. 10 tablet Arvis Huxley NOVAK, PA-C      I have reviewed the PDMP during this encounter.      Arvis Huxley NOVAK, PA-C 02/17/24 [phone]

## 2024-02-17 NOTE — Discharge Instructions (Signed)
-  Negative COVID -COPD exacerbation - I sent doxycycline  and prednisone . - Take Promethazine  DM during day for cough and cheratussin at bedtime if needed for cough keeping you awake at night. - Increase rest and fluids.  Use your inhalers and nebulizers as well for any shortness of breath. - Follow-up here if fever, worsening symptoms including increased breathing difficulty or if you are not feeling better in the next several days.

## 2024-02-17 NOTE — ED Triage Notes (Signed)
 Pt c/o cough and burning in chest x4days  Pt declines a covid test and states that he did a home covid test and it was negative  Pt was around her daughter who was sick.
